# Patient Record
Sex: Female | Born: 1935 | Race: White | Hispanic: No | Smoking: Former smoker
Health system: Southern US, Community
[De-identification: ages and names within clinical notes are randomized; demographics above are authoritative.]

## PROBLEM LIST (undated history)

## (undated) DIAGNOSIS — M549 Dorsalgia, unspecified: Secondary | ICD-10-CM

## (undated) DIAGNOSIS — Z860100 Personal history of colon polyps, unspecified: Secondary | ICD-10-CM

## (undated) DIAGNOSIS — I10 Essential (primary) hypertension: Secondary | ICD-10-CM

## (undated) DIAGNOSIS — R011 Cardiac murmur, unspecified: Secondary | ICD-10-CM

## (undated) DIAGNOSIS — K219 Gastro-esophageal reflux disease without esophagitis: Secondary | ICD-10-CM

## (undated) DIAGNOSIS — H9190 Unspecified hearing loss, unspecified ear: Secondary | ICD-10-CM

## (undated) DIAGNOSIS — R05 Cough: Secondary | ICD-10-CM

## (undated) DIAGNOSIS — K649 Unspecified hemorrhoids: Secondary | ICD-10-CM

## (undated) DIAGNOSIS — Z9289 Personal history of other medical treatment: Secondary | ICD-10-CM

## (undated) DIAGNOSIS — Z8489 Family history of other specified conditions: Secondary | ICD-10-CM

## (undated) DIAGNOSIS — R0600 Dyspnea, unspecified: Secondary | ICD-10-CM

## (undated) DIAGNOSIS — M1990 Unspecified osteoarthritis, unspecified site: Secondary | ICD-10-CM

## (undated) DIAGNOSIS — F419 Anxiety disorder, unspecified: Secondary | ICD-10-CM

## (undated) DIAGNOSIS — R350 Frequency of micturition: Secondary | ICD-10-CM

## (undated) DIAGNOSIS — K5790 Diverticulosis of intestine, part unspecified, without perforation or abscess without bleeding: Secondary | ICD-10-CM

## (undated) DIAGNOSIS — G8929 Other chronic pain: Secondary | ICD-10-CM

## (undated) DIAGNOSIS — R059 Cough, unspecified: Secondary | ICD-10-CM

## (undated) DIAGNOSIS — J45909 Unspecified asthma, uncomplicated: Secondary | ICD-10-CM

## (undated) HISTORY — PX: CERVICAL FUSION: SHX112

## (undated) HISTORY — PX: BACK SURGERY: SHX140

## (undated) HISTORY — PX: EYE SURGERY: SHX253

## (undated) HISTORY — PX: COLONOSCOPY: SHX174

## (undated) HISTORY — PX: JOINT REPLACEMENT: SHX530

---

## 2012-12-30 ENCOUNTER — Other Ambulatory Visit: Payer: Self-pay | Admitting: Neurological Surgery

## 2013-01-01 ENCOUNTER — Encounter (HOSPITAL_COMMUNITY): Payer: Self-pay | Admitting: Pharmacy Technician

## 2013-01-05 ENCOUNTER — Encounter (HOSPITAL_COMMUNITY): Payer: Self-pay | Admitting: *Deleted

## 2013-01-05 NOTE — Progress Notes (Signed)
Pt saw a cardiologist >68yrs ago but doesn't have one now  Stress test/echo done >1yrs ago  Denies ever having a heart cath  Dr.Randall Mellody Dance at [facility]  Denies EKG or CXR in past yr

## 2013-01-06 MED ORDER — CEFAZOLIN SODIUM-DEXTROSE 2-3 GM-% IV SOLR
2.0000 g | INTRAVENOUS | Status: AC
  Administered 2013-01-07: 2 g via INTRAVENOUS

## 2013-01-07 ENCOUNTER — Ambulatory Visit (HOSPITAL_COMMUNITY): Payer: Medicare Other

## 2013-01-07 ENCOUNTER — Encounter (HOSPITAL_COMMUNITY)
Admission: RE | Disposition: A | Discharge status: Home / Self Care | Payer: Self-pay | Source: Ambulatory Visit | Attending: Neurological Surgery

## 2013-01-07 ENCOUNTER — Encounter (HOSPITAL_COMMUNITY): Payer: Self-pay | Admitting: Anesthesiology

## 2013-01-07 ENCOUNTER — Ambulatory Visit (HOSPITAL_COMMUNITY)
Admission: RE | Admit: 2013-01-07 | Discharge: 2013-01-08 | Disposition: A | Discharge status: Home / Self Care | Payer: Medicare Other | Source: Ambulatory Visit | Attending: Neurological Surgery | Admitting: Neurological Surgery

## 2013-01-07 ENCOUNTER — Ambulatory Visit (HOSPITAL_COMMUNITY): Payer: Medicare Other | Admitting: Anesthesiology

## 2013-01-07 DIAGNOSIS — I10 Essential (primary) hypertension: Secondary | ICD-10-CM | POA: Insufficient documentation

## 2013-01-07 DIAGNOSIS — M5126 Other intervertebral disc displacement, lumbar region: Secondary | ICD-10-CM | POA: Insufficient documentation

## 2013-01-07 DIAGNOSIS — Z9889 Other specified postprocedural states: Secondary | ICD-10-CM

## 2013-01-07 DIAGNOSIS — Z981 Arthrodesis status: Secondary | ICD-10-CM | POA: Insufficient documentation

## 2013-01-07 HISTORY — DX: Personal history of other medical treatment: Z92.89

## 2013-01-07 HISTORY — PX: LUMBAR LAMINECTOMY/DECOMPRESSION MICRODISCECTOMY: SHX5026

## 2013-01-07 HISTORY — DX: Cough, unspecified: R05.9

## 2013-01-07 HISTORY — DX: Other chronic pain: G89.29

## 2013-01-07 HISTORY — DX: Personal history of colon polyps, unspecified: Z86.0100

## 2013-01-07 HISTORY — DX: Unspecified asthma, uncomplicated: J45.909

## 2013-01-07 HISTORY — DX: Anxiety disorder, unspecified: F41.9

## 2013-01-07 HISTORY — DX: Personal history of colonic polyps: Z86.010

## 2013-01-07 HISTORY — DX: Dorsalgia, unspecified: M54.9

## 2013-01-07 HISTORY — DX: Unspecified hemorrhoids: K64.9

## 2013-01-07 HISTORY — DX: Cough: R05

## 2013-01-07 HISTORY — DX: Family history of other specified conditions: Z84.89

## 2013-01-07 HISTORY — DX: Gastro-esophageal reflux disease without esophagitis: K21.9

## 2013-01-07 HISTORY — DX: Diverticulosis of intestine, part unspecified, without perforation or abscess without bleeding: K57.90

## 2013-01-07 HISTORY — DX: Cardiac murmur, unspecified: R01.1

## 2013-01-07 HISTORY — DX: Unspecified osteoarthritis, unspecified site: M19.90

## 2013-01-07 HISTORY — DX: Essential (primary) hypertension: I10

## 2013-01-07 HISTORY — DX: Frequency of micturition: R35.0

## 2013-01-07 LAB — CBC WITH DIFFERENTIAL/PLATELET
Basophils Absolute: 0 10*3/uL (ref 0.0–0.1)
Basophils Relative: 0 % (ref 0–1)
Eosinophils Absolute: 0.2 10*3/uL (ref 0.0–0.7)
Eosinophils Relative: 2 % (ref 0–5)
HCT: 39 % (ref 36.0–46.0)
Hemoglobin: 13.3 g/dL (ref 12.0–15.0)
Lymphocytes Relative: 27 % (ref 12–46)
Lymphs Abs: 2.3 10*3/uL (ref 0.7–4.0)
MCH: 30.4 pg (ref 26.0–34.0)
MCHC: 34.1 g/dL (ref 30.0–36.0)
MCV: 89.2 fL (ref 78.0–100.0)
Monocytes Absolute: 0.9 10*3/uL (ref 0.1–1.0)
Monocytes Relative: 10 % (ref 3–12)
Neutro Abs: 5.3 10*3/uL (ref 1.7–7.7)
Neutrophils Relative %: 61 % (ref 43–77)
Platelets: 272 10*3/uL (ref 150–400)
RBC: 4.37 MIL/uL (ref 3.87–5.11)
RDW: 13.2 % (ref 11.5–15.5)
WBC: 8.7 10*3/uL (ref 4.0–10.5)

## 2013-01-07 LAB — BASIC METABOLIC PANEL
BUN: 19 mg/dL (ref 6–23)
CO2: 30 mEq/L (ref 19–32)
Calcium: 9.7 mg/dL (ref 8.4–10.5)
Chloride: 102 mEq/L (ref 96–112)
Creatinine, Ser: 0.77 mg/dL (ref 0.50–1.10)
GFR calc Af Amer: 87 mL/min — ABNORMAL LOW (ref >90)
GFR calc non Af Amer: 75 mL/min — ABNORMAL LOW (ref >90)
Glucose, Bld: 89 mg/dL (ref 70–99)
Potassium: 3.8 mEq/L (ref 3.5–5.1)
Sodium: 139 mEq/L (ref 135–145)

## 2013-01-07 LAB — PROTIME-INR
INR: 1.02 (ref 0.00–1.49)
Prothrombin Time: 13.3 seconds (ref 11.6–15.2)

## 2013-01-07 LAB — SURGICAL PCR SCREEN
MRSA, PCR: NEGATIVE
Staphylococcus aureus: NEGATIVE

## 2013-01-07 SURGERY — LUMBAR LAMINECTOMY/DECOMPRESSION MICRODISCECTOMY 1 LEVEL
Anesthesia: General | Site: Back | Laterality: Left | Wound class: Clean

## 2013-01-07 MED ORDER — DEXAMETHASONE SODIUM PHOSPHATE 10 MG/ML IJ SOLN: 10.0000 mg | INTRAMUSCULAR | Status: DC

## 2013-01-07 MED ORDER — SODIUM CHLORIDE 0.9 % IJ SOLN: 3.0000 mL | INTRAMUSCULAR | Status: DC | PRN

## 2013-01-07 MED ORDER — LACTATED RINGERS IV SOLN
INTRAVENOUS | Status: DC | PRN
  Administered 2013-01-07 (×2): via INTRAVENOUS

## 2013-01-07 MED ORDER — NEOSTIGMINE METHYLSULFATE 1 MG/ML IJ SOLN
INTRAMUSCULAR | Status: DC | PRN
  Administered 2013-01-07: 3 mg via INTRAVENOUS

## 2013-01-07 MED ORDER — CEFAZOLIN SODIUM 1-5 GM-% IV SOLN
1.0000 g | Freq: Three times a day (TID) | INTRAVENOUS | Status: AC
  Administered 2013-01-07 – 2013-01-08 (×2): 1 g via INTRAVENOUS
  Filled 2013-01-07 (×2): qty 50

## 2013-01-07 MED ORDER — ONDANSETRON HCL 4 MG/2ML IJ SOLN: 4.0000 mg | Freq: Once | INTRAMUSCULAR | Status: DC | PRN

## 2013-01-07 MED ORDER — LIDOCAINE HCL (CARDIAC) 20 MG/ML IV SOLN
INTRAVENOUS | Status: DC | PRN
  Administered 2013-01-07: 60 mg via INTRAVENOUS

## 2013-01-07 MED ORDER — BACITRACIN 50000 UNITS IM SOLR
INTRAMUSCULAR | Status: AC
  Filled 2013-01-07: qty 1

## 2013-01-07 MED ORDER — GELATIN ABSORBABLE MT POWD
OROMUCOSAL | Status: DC | PRN
  Administered 2013-01-07: 15:00:00 via TOPICAL

## 2013-01-07 MED ORDER — SODIUM CHLORIDE 0.9 % IR SOLN
Status: DC | PRN
  Administered 2013-01-07: 14:00:00

## 2013-01-07 MED ORDER — ONDANSETRON HCL 4 MG/2ML IJ SOLN
INTRAMUSCULAR | Status: DC | PRN
  Administered 2013-01-07: 4 mg via INTRAVENOUS

## 2013-01-07 MED ORDER — HYDROMORPHONE HCL PF 1 MG/ML IJ SOLN: 0.2500 mg | INTRAMUSCULAR | Status: DC | PRN

## 2013-01-07 MED ORDER — LIDOCAINE HCL 4 % MT SOLN
OROMUCOSAL | Status: DC | PRN
  Administered 2013-01-07: 4 mL via TOPICAL

## 2013-01-07 MED ORDER — ONDANSETRON HCL 4 MG/2ML IJ SOLN: 4.0000 mg | INTRAMUSCULAR | Status: DC | PRN

## 2013-01-07 MED ORDER — SODIUM CHLORIDE 0.9 % IV SOLN
INTRAVENOUS | Status: AC
  Filled 2013-01-07: qty 500

## 2013-01-07 MED ORDER — CITALOPRAM HYDROBROMIDE 10 MG PO TABS
10.0000 mg | ORAL_TABLET | Freq: Every day | ORAL | Status: DC
Start: 2013-01-07 — End: 2013-01-08
  Filled 2013-01-07 (×2): qty 1

## 2013-01-07 MED ORDER — GLYCOPYRROLATE 0.2 MG/ML IJ SOLN
INTRAMUSCULAR | Status: DC | PRN
  Administered 2013-01-07 (×2): 0.2 mg via INTRAVENOUS

## 2013-01-07 MED ORDER — FENTANYL CITRATE 0.05 MG/ML IJ SOLN
INTRAMUSCULAR | Status: AC
  Filled 2013-01-07: qty 2

## 2013-01-07 MED ORDER — HYDROCODONE-ACETAMINOPHEN 5-325 MG PO TABS: 1.0000 | ORAL_TABLET | ORAL | Status: DC | PRN

## 2013-01-07 MED ORDER — DEXAMETHASONE 4 MG PO TABS
4.0000 mg | ORAL_TABLET | Freq: Four times a day (QID) | ORAL | Status: DC
  Administered 2013-01-07: 4 mg via ORAL
  Filled 2013-01-07 (×5): qty 1

## 2013-01-07 MED ORDER — DEXAMETHASONE SODIUM PHOSPHATE 4 MG/ML IJ SOLN
4.0000 mg | Freq: Four times a day (QID) | INTRAMUSCULAR | Status: DC
  Administered 2013-01-08: 4 mg via INTRAVENOUS
  Filled 2013-01-07 (×5): qty 1

## 2013-01-07 MED ORDER — ALBUMIN HUMAN 5 % IV SOLN
INTRAVENOUS | Status: DC | PRN
  Administered 2013-01-07: 14:00:00 via INTRAVENOUS

## 2013-01-07 MED ORDER — CEFAZOLIN SODIUM-DEXTROSE 2-3 GM-% IV SOLR
2.0000 g | Freq: Once | INTRAVENOUS | Status: DC
  Filled 2013-01-07: qty 50

## 2013-01-07 MED ORDER — THROMBIN 5000 UNITS EX SOLR
CUTANEOUS | Status: DC | PRN
  Administered 2013-01-07 (×2): 5000 [IU] via TOPICAL

## 2013-01-07 MED ORDER — BUPIVACAINE HCL (PF) 0.25 % IJ SOLN
INTRAMUSCULAR | Status: DC | PRN
  Administered 2013-01-07: 10 mL

## 2013-01-07 MED ORDER — ACETAMINOPHEN 10 MG/ML IV SOLN
INTRAVENOUS | Status: AC
  Administered 2013-01-07: 1000 mg via INTRAVENOUS
  Filled 2013-01-07: qty 100

## 2013-01-07 MED ORDER — ARTIFICIAL TEARS OP OINT
TOPICAL_OINTMENT | OPHTHALMIC | Status: DC | PRN
  Administered 2013-01-07: 1 via OPHTHALMIC

## 2013-01-07 MED ORDER — MENTHOL 3 MG MT LOZG: 1.0000 | LOZENGE | OROMUCOSAL | Status: DC | PRN

## 2013-01-07 MED ORDER — ROCURONIUM BROMIDE 100 MG/10ML IV SOLN
INTRAVENOUS | Status: DC | PRN
  Administered 2013-01-07: 30 mg via INTRAVENOUS

## 2013-01-07 MED ORDER — MORPHINE SULFATE 2 MG/ML IJ SOLN: 1.0000 mg | INTRAMUSCULAR | Status: DC | PRN

## 2013-01-07 MED ORDER — MUPIROCIN 2 % EX OINT
TOPICAL_OINTMENT | CUTANEOUS | Status: AC
  Filled 2013-01-07: qty 22

## 2013-01-07 MED ORDER — PHENOL 1.4 % MT LIQD: 1.0000 | OROMUCOSAL | Status: DC | PRN

## 2013-01-07 MED ORDER — FENTANYL CITRATE 0.05 MG/ML IJ SOLN
INTRAMUSCULAR | Status: DC | PRN
  Administered 2013-01-07: 100 ug via INTRAVENOUS
  Administered 2013-01-07: 50 ug via INTRAVENOUS

## 2013-01-07 MED ORDER — ACETAMINOPHEN 325 MG PO TABS: 650.0000 mg | ORAL_TABLET | ORAL | Status: DC | PRN

## 2013-01-07 MED ORDER — PROPOFOL 10 MG/ML IV BOLUS
INTRAVENOUS | Status: DC | PRN
  Administered 2013-01-07: 120 mg via INTRAVENOUS

## 2013-01-07 MED ORDER — DEXAMETHASONE SODIUM PHOSPHATE 10 MG/ML IJ SOLN
INTRAMUSCULAR | Status: AC
  Administered 2013-01-07: 10 mg via INTRAVENOUS
  Filled 2013-01-07: qty 1

## 2013-01-07 MED ORDER — POTASSIUM CHLORIDE IN NACL 20-0.9 MEQ/L-% IV SOLN
INTRAVENOUS | Status: DC
  Filled 2013-01-07 (×3): qty 1000

## 2013-01-07 MED ORDER — HEMOSTATIC AGENTS (NO CHARGE) OPTIME
TOPICAL | Status: DC | PRN
  Administered 2013-01-07: 1 via TOPICAL

## 2013-01-07 MED ORDER — BUDESONIDE-FORMOTEROL FUMARATE 160-4.5 MCG/ACT IN AERO
2.0000 | INHALATION_SPRAY | Freq: Every day | RESPIRATORY_TRACT | Status: DC | PRN
  Filled 2013-01-07: qty 6

## 2013-01-07 MED ORDER — SODIUM CHLORIDE 0.9 % IJ SOLN
3.0000 mL | Freq: Two times a day (BID) | INTRAMUSCULAR | Status: DC
  Administered 2013-01-07: 3 mL via INTRAVENOUS

## 2013-01-07 MED ORDER — IRBESARTAN 75 MG PO TABS
75.0000 mg | ORAL_TABLET | Freq: Every day | ORAL | Status: DC
  Administered 2013-01-07: 75 mg via ORAL
  Filled 2013-01-07 (×2): qty 1

## 2013-01-07 MED ORDER — ACETAMINOPHEN 650 MG RE SUPP: 650.0000 mg | RECTAL | Status: DC | PRN

## 2013-01-07 MED ORDER — METOPROLOL SUCCINATE ER 25 MG PO TB24
25.0000 mg | ORAL_TABLET | Freq: Every day | ORAL | Status: DC
  Filled 2013-01-07: qty 1

## 2013-01-07 MED ORDER — PHENYLEPHRINE HCL 10 MG/ML IJ SOLN
INTRAMUSCULAR | Status: DC | PRN
  Administered 2013-01-07: 120 ug via INTRAVENOUS
  Administered 2013-01-07 (×4): 80 ug via INTRAVENOUS
  Administered 2013-01-07: 40 ug via INTRAVENOUS
  Administered 2013-01-07 (×3): 80 ug via INTRAVENOUS

## 2013-01-07 MED ORDER — ACETAMINOPHEN 10 MG/ML IV SOLN
1000.0000 mg | Freq: Four times a day (QID) | INTRAVENOUS | Status: DC
  Administered 2013-01-07 – 2013-01-08 (×2): 1000 mg via INTRAVENOUS
  Filled 2013-01-07 (×3): qty 100

## 2013-01-07 SURGICAL SUPPLY — 47 items
BAG DECANTER FOR FLEXI CONT (MISCELLANEOUS) ×2 IMPLANT
BENZOIN TINCTURE PRP APPL 2/3 (GAUZE/BANDAGES/DRESSINGS) ×2 IMPLANT
BUR MATCHSTICK NEURO 3.0 LAGG (BURR) ×2 IMPLANT
CANISTER SUCTION 2500CC (MISCELLANEOUS) ×2 IMPLANT
CLOTH BEACON ORANGE TIMEOUT ST (SAFETY) ×2 IMPLANT
CONT SPEC 4OZ CLIKSEAL STRL BL (MISCELLANEOUS) ×2 IMPLANT
DRAPE LAPAROTOMY 100X72X124 (DRAPES) ×2 IMPLANT
DRAPE MICROSCOPE ZEISS OPMI (DRAPES) ×2 IMPLANT
DRAPE POUCH INSTRU U-SHP 10X18 (DRAPES) ×2 IMPLANT
DRAPE SURG 17X23 STRL (DRAPES) ×2 IMPLANT
DRESSING TELFA 8X3 (GAUZE/BANDAGES/DRESSINGS) ×2 IMPLANT
DRSG OPSITE 4X5.5 SM (GAUZE/BANDAGES/DRESSINGS) ×2 IMPLANT
DURAPREP 26ML APPLICATOR (WOUND CARE) ×2 IMPLANT
ELECT REM PT RETURN 9FT ADLT (ELECTROSURGICAL) ×2
ELECTRODE REM PT RTRN 9FT ADLT (ELECTROSURGICAL) ×1 IMPLANT
GAUZE SPONGE 4X4 16PLY XRAY LF (GAUZE/BANDAGES/DRESSINGS) IMPLANT
GLOVE BIOGEL M 8.0 STRL (GLOVE) ×4 IMPLANT
GLOVE BIOGEL PI IND STRL 6.5 (GLOVE) ×1 IMPLANT
GLOVE BIOGEL PI IND STRL 7.0 (GLOVE) ×2 IMPLANT
GLOVE BIOGEL PI INDICATOR 6.5 (GLOVE) ×1
GLOVE BIOGEL PI INDICATOR 7.0 (GLOVE) ×2
GLOVE SS BIOGEL STRL SZ 6.5 (GLOVE) ×2 IMPLANT
GLOVE SUPERSENSE BIOGEL SZ 6.5 (GLOVE) ×2
GLOVE SURG SS PI 7.0 STRL IVOR (GLOVE) ×6 IMPLANT
GOWN BRE IMP SLV AUR LG STRL (GOWN DISPOSABLE) ×2 IMPLANT
GOWN BRE IMP SLV AUR XL STRL (GOWN DISPOSABLE) ×6 IMPLANT
GOWN STRL REIN 2XL LVL4 (GOWN DISPOSABLE) IMPLANT
HEMOSTAT POWDER KIT SURGIFOAM (HEMOSTASIS) IMPLANT
HEMOSTAT POWDER SURGIFOAM 1G (HEMOSTASIS) ×2 IMPLANT
KIT BASIN OR (CUSTOM PROCEDURE TRAY) ×2 IMPLANT
KIT ROOM TURNOVER OR (KITS) ×2 IMPLANT
NEEDLE HYPO 25X1 1.5 SAFETY (NEEDLE) ×2 IMPLANT
NEEDLE SPNL 20GX3.5 QUINCKE YW (NEEDLE) IMPLANT
NS IRRIG 1000ML POUR BTL (IV SOLUTION) ×2 IMPLANT
PACK LAMINECTOMY NEURO (CUSTOM PROCEDURE TRAY) ×2 IMPLANT
PAD ARMBOARD 7.5X6 YLW CONV (MISCELLANEOUS) ×6 IMPLANT
RUBBERBAND STERILE (MISCELLANEOUS) ×4 IMPLANT
SPONGE SURGIFOAM ABS GEL SZ50 (HEMOSTASIS) ×2 IMPLANT
STRIP CLOSURE SKIN 1/2X4 (GAUZE/BANDAGES/DRESSINGS) ×2 IMPLANT
SUT VIC AB 0 CT1 18XCR BRD8 (SUTURE) ×1 IMPLANT
SUT VIC AB 0 CT1 8-18 (SUTURE) ×1
SUT VIC AB 2-0 CP2 18 (SUTURE) ×2 IMPLANT
SUT VIC AB 3-0 SH 8-18 (SUTURE) ×2 IMPLANT
SYR 20ML ECCENTRIC (SYRINGE) ×2 IMPLANT
TOWEL OR 17X24 6PK STRL BLUE (TOWEL DISPOSABLE) ×2 IMPLANT
TOWEL OR 17X26 10 PK STRL BLUE (TOWEL DISPOSABLE) ×2 IMPLANT
WATER STERILE IRR 1000ML POUR (IV SOLUTION) ×2 IMPLANT

## 2013-01-07 NOTE — Anesthesia Preprocedure Evaluation (Addendum)
Anesthesia Evaluation  Patient identified by MRN, date of birth, ID band Patient awake    Reviewed: Allergy & Precautions, H&P , NPO status , reviewed documented beta blocker date and time   History of Anesthesia Complications (+) Family history of anesthesia reaction  Airway Mallampati: I TM Distance: >3 FB Neck ROM: full    Dental  (+) Teeth Intact and Dental Advisory Given   Pulmonary asthma (inhaler) ,          Cardiovascular hypertension, Pt. on medications and Pt. on home beta blockers + Valvular Problems/Murmurs (murmur) Rhythm:regular Rate:Normal     Neuro/Psych PSYCHIATRIC DISORDERS Anxiety    GI/Hepatic Neg liver ROS, GERD-  Medicated and Controlled,  Endo/Other  negative endocrine ROS  Renal/GU negative Renal ROS     Musculoskeletal  (+) Arthritis -, Osteoarthritis,    Abdominal (+) + obese,   Peds  Hematology negative hematology ROS (+)   Anesthesia Other Findings   Reproductive/Obstetrics negative OB ROS                        Anesthesia Physical Anesthesia Plan  ASA: II  Anesthesia Plan: General   Post-op Pain Management:    Induction: Intravenous  Airway Management Planned: Oral ETT  Additional Equipment:   Intra-op Plan:   Post-operative Plan: Extubation in OR  Informed Consent: I have reviewed the patients History and Physical, chart, labs and discussed the procedure including the risks, benefits and alternatives for the proposed anesthesia with the patient or authorized representative who has indicated his/her understanding and acceptance.     Plan Discussed with: CRNA, Anesthesiologist and Surgeon  Anesthesia Plan Comments:         Anesthesia Quick Evaluation

## 2013-01-07 NOTE — Transfer of Care (Signed)
Immediate Anesthesia Transfer of Care Note  Patient: Robin Patterson  Procedure(s) Performed: Procedure(s) with comments: LEFT LUMBAR THREE TO FOUR LAMINECTOMY/DECOMPRESSION MICRODISCECTOMY (Left) - LEFT LUMBAR THREE TO FOUR LAMINECTOMY/DECOMPRESSION MICRODISCECTOMY  Patient Location: PACU  Anesthesia Type:General  Level of Consciousness: awake, oriented and patient cooperative  Airway & Oxygen Therapy: Patient Spontanous Breathing and Patient connected to nasal cannula oxygen  Post-op Assessment: Report given to PACU RN and Post -op Vital signs reviewed and stable  Post vital signs: Reviewed and stable  Complications: No apparent anesthesia complications

## 2013-01-07 NOTE — Plan of Care (Signed)
Problem: Consults Goal: Diagnosis - Spinal Surgery Outcome: Completed/Met Date Met:  01/07/13 Microdiscectomy

## 2013-01-07 NOTE — Anesthesia Postprocedure Evaluation (Signed)
  Anesthesia Post-op Note  Patient: Robin Patterson  Procedure(s) Performed: Procedure(s) with comments: LEFT LUMBAR THREE TO FOUR LAMINECTOMY/DECOMPRESSION MICRODISCECTOMY (Left) - LEFT LUMBAR THREE TO FOUR LAMINECTOMY/DECOMPRESSION MICRODISCECTOMY  Patient Location: PACU  Anesthesia Type:General  Level of Consciousness: awake, oriented and patient cooperative  Airway and Oxygen Therapy: Patient Spontanous Breathing  Post-op Pain: mild  Post-op Assessment: Post-op Vital signs reviewed, Patient's Cardiovascular Status Stable, Respiratory Function Stable, Patent Airway, No signs of Nausea or vomiting and Pain level controlled  Post-op Vital Signs: stable  Complications: No apparent anesthesia complications

## 2013-01-07 NOTE — Anesthesia Procedure Notes (Signed)
Procedure Name: Intubation Date/Time: 01/07/2013 1:45 PM Performed by: Leona Singleton A Pre-anesthesia Checklist: Patient identified Patient Re-evaluated:Patient Re-evaluated prior to inductionOxygen Delivery Method: Circle system utilized Preoxygenation: Pre-oxygenation with 100% oxygen Intubation Type: IV induction Ventilation: Mask ventilation without difficulty Laryngoscope Size: Miller and 2 Grade View: Grade I Tube type: Oral Tube size: 7.5 mm Number of attempts: 1 Airway Equipment and Method: Stylet and LTA kit utilized Placement Confirmation: ETT inserted through vocal cords under direct vision,  positive ETCO2 and breath sounds checked- equal and bilateral Secured at: 21 cm Tube secured with: Tape Dental Injury: Teeth and Oropharynx as per pre-operative assessment

## 2013-01-07 NOTE — Preoperative (Signed)
Beta Blockers   Reason not to administer Beta Blockers:toprol xl 01/07/13 0800

## 2013-01-07 NOTE — H&P (Signed)
Subjective: Patient is a 79 y.o. female admitted for L L3-4 microdiskectomy. Onset of symptoms was several weeks ago, gradually worsening since that time.  The pain is rated severe, and is located at the across the lower back and radiates to l leg. The pain is described as aching and occurs intermittently. The symptoms have been progressive. Symptoms are exacerbated by exercise and standing. MRI or CT showed HNP L3-4 left.   Past Medical History  Diagnosis Date  . Family history of anesthesia complication     sister wakes up extremely sick  . Hypertension     takes Benicar and Metoprolol daily  . Heart murmur   . Asthma     Symbicort inhaler prn  . Cough   . Chronic back pain     HNP  . Arthritis   . GERD (gastroesophageal reflux disease)     doesn't take any meds;watching what she eats  . Hemorrhoids   . History of colon polyps   . Diverticulosis   . Urinary frequency   . History of blood transfusion     no abnormal reaction noted  . Anxiety     takes Citalopram daily    Past Surgical History  Procedure Laterality Date  . Joint replacement      right  . Cervical fusion    . Eye surgery      bil cataract  . Colonoscopy      Prior to Admission medications   Medication Sig Start Date End Date Taking? Authorizing Provider  budesonide-formoterol (SYMBICORT) 160-4.5 MCG/ACT inhaler Inhale 2 puffs into the lungs daily as needed (for wheezing).   Yes Historical Provider, MD  Cholecalciferol (VITAMIN D3) 2000 UNITS capsule Take 2,000 Units by mouth daily.   Yes Historical Provider, MD  citalopram (CELEXA) 10 MG tablet Take 10 mg by mouth daily.   Yes Historical Provider, MD  ibuprofen (ADVIL,MOTRIN) 200 MG tablet Take 400 mg by mouth every 6 (six) hours as needed for pain.   Yes Historical Provider, MD  Boris Lown Oil 300 MG CAPS Take 1 capsule by mouth daily.   Yes Historical Provider, MD  metoprolol succinate (TOPROL-XL) 50 MG 24 hr tablet Take 25 mg by mouth daily. Take with or  immediately following a meal.   Yes Historical Provider, MD  Multiple Vitamin (MULTIVITAMIN WITH MINERALS) TABS Take 1 tablet by mouth daily.   Yes Historical Provider, MD  olmesartan (BENICAR) 20 MG tablet Take 20 mg by mouth daily.   Yes Historical Provider, MD   No Known Allergies  History  Substance Use Topics  . Smoking status: Never Smoker   . Smokeless tobacco: Not on file  . Alcohol Use: No    History reviewed. No pertinent family history.   Review of Systems  Positive ROS: neg  All other systems have been reviewed and were otherwise negative with the exception of those mentioned in the HPI and as above.  Objective: Vital signs in last 24 hours: Temp:  [97.5 F (36.4 C)] 97.5 F (36.4 C) (04/10 1036) Pulse Rate:  [86] 86 (04/10 1036) Resp:  [18] 18 (04/10 1036) BP: (143)/(86) 143/86 mmHg (04/10 1036) SpO2:  [98 %] 98 % (04/10 1036)  General Appearance: Alert, cooperative, no distress, appears stated age Head: Normocephalic, without obvious abnormality, atraumatic Eyes: PERRL, conjunctiva/corneas clear, EOM's intact, fundi benign, both eyes      Ears: Normal TM's and external ear canals, both ears Throat: Lips, mucosa, and tongue normal; teeth and gums normal Neck:  Supple, symmetrical, trachea midline, no adenopathy; thyroid: No enlargement/tenderness/nodules; no carotid bruit or JVD Back: Symmetric, no curvature, ROM normal, no CVA tenderness Lungs: Clear to auscultation bilaterally, respirations unlabored Heart: Regular rate and rhythm, S1 and S2 normal, no murmur, rub or gallop Abdomen: Soft, non-tender, bowel sounds active all four quadrants, no masses, no organomegaly Extremities: Extremities normal, atraumatic, no cyanosis or edema Pulses: 2+ and symmetric all extremities Skin: Skin color, texture, turgor normal, no rashes or lesions  NEUROLOGIC:   Mental status: Alert and oriented x4,  no aphasia, good attention span, fund of knowledge, and memory Motor  Exam - grossly normal Sensory Exam - grossly normal Reflexes: 1+ Coordination - grossly normal Gait - grossly normal Balance - grossly normal Cranial Nerves: I: smell Not tested  II: visual acuity  OS: nl    OD: nl  II: visual fields Full to confrontation  II: pupils Equal, round, reactive to light  III,VII: ptosis None  III,IV,VI: extraocular muscles  Full ROM  V: mastication Normal  V: facial light touch sensation  Normal  V,VII: corneal reflex  Present  VII: facial muscle function - upper  Normal  VII: facial muscle function - lower Normal  VIII: hearing Not tested  IX: soft palate elevation  Normal  IX,X: gag reflex Present  XI: trapezius strength  5/5  XI: sternocleidomastoid strength 5/5  XI: neck flexion strength  5/5  XII: tongue strength  Normal    Data Review Lab Results  Component Value Date   WBC 8.7 01/07/2013   HGB 13.3 01/07/2013   HCT 39.0 01/07/2013   MCV 89.2 01/07/2013   PLT 272 01/07/2013   Lab Results  Component Value Date   NA 139 01/07/2013   K 3.8 01/07/2013   CL 102 01/07/2013   CO2 30 01/07/2013   BUN 19 01/07/2013   CREATININE 0.77 01/07/2013   GLUCOSE 89 01/07/2013   Lab Results  Component Value Date   INR 1.02 01/07/2013    Assessment/Plan: Patient admitted for L L3-4 microdiskectomy. Patient has failed conservative therapy.  I explained the condition and procedure to the patient and answered any questions.  Patient wishes to proceed with procedure as planned. Understands risks/ benefits and typical outcomes of procedure.   JONES,DAVID S 01/07/2013 1:34 PM

## 2013-01-07 NOTE — Op Note (Signed)
01/07/2013  3:20 PM  PATIENT:  Robin Patterson  79 y.o. female  PRE-OPERATIVE DIAGNOSIS:  L L3-4 HNP with superior free fragment with L3 and L4 radiculopathy  POST-OPERATIVE DIAGNOSIS:  same  PROCEDURE:  Left L3-4 hemilaminectomy, medial facetectomy, and foraminotomies followed by microdiscectomy to decompress both the L3 and the L4 nerve roots  SURGEON:  Marikay Alar, MD  ASSISTANTS: Dr. Jeral Fruit  ANESTHESIA:   General  EBL: 50 ml  Total I/O In: 1250 [I.V.:1000; IV Piggyback:250] Out: 150 [Blood:150]  BLOOD ADMINISTERED:none  DRAINS: None   SPECIMEN:  No Specimen  INDICATION FOR PROCEDURE: This patient presented with severe left anterior thigh pain to the knee. She had an MRI which showed a herniated disc a free fragment extending up to the L3 nerve root. She tried medical management for many weeks without relief. Recommended a left microdiscectomy L3-4. Patient understood the risks, benefits, and alternatives and potential outcomes and wished to proceed.  PROCEDURE DETAILS: The patient was taken to the operating room and after induction of adequate generalized endotracheal anesthesia, the patient was rolled into the prone position on the Wilson frame and all pressure points were padded. The lumbar region was cleaned and then prepped with DuraPrep and draped in the usual sterile fashion. 5 cc of local anesthesia was injected and then a dorsal midline incision was made and carried down to the lumbo sacral fascia. The fascia was opened and the paraspinous musculature was taken down in a subperiosteal fashion to expose L3-4 on the left. Intraoperative x-ray confirmed my level, and then I used a combination of the high-speed drill and the Kerrison punches to perform a hemilaminectomy, medial facetectomy, and foraminotomy at L3-4 on the left. The underlying yellow ligament was opened and removed in a piecemeal fashion to expose the underlying dura and exiting L4 nerve root. I undercut  the lateral recess and dissected down until I was medial to and distal to the pedicle. The nerve root was well decompressed. A performed a generous hemilaminotomy and foraminotomies to identify both the L3 and the L4 nerve roots. We then gently retracted the nerve root medially with a retractor, coagulated the epidural venous vasculature, and  Found a small free fragment under the L3 nerve root on the left. I continued to palpate under the nerve root with a coronary dilator. I had a nice decompression of the nerve root and the midline. I then palpated with a coronary dilator along the L3 and L4 nerve roots and into the foramen to assure adequate decompression. I felt no more compression of the nerve root. I irrigated with saline solution containing bacitracin. Achieved hemostasis with bipolar cautery, lined the dura with Gelfoam, and then closed the fascia with 0 Vicryl. I closed the subcutaneous tissues with 2-0 Vicryl and the subcuticular tissues with 3-0 Vicryl. The skin was then closed with benzoin and Steri-Strips. The drapes were removed, a sterile dressing was applied. The patient was awakened from general anesthesia and transferred to the recovery room in stable condition. At the end of the procedure all sponge, needle and instrument counts were correct.   PLAN OF CARE: Admit to inpatient   PATIENT DISPOSITION:  PACU - hemodynamically stable.   Delay start of Pharmacological VTE agent (>24hrs) due to surgical blood loss or risk of bleeding:  yes

## 2013-01-08 ENCOUNTER — Encounter (HOSPITAL_COMMUNITY): Payer: Self-pay | Admitting: Neurological Surgery

## 2013-01-08 MED FILL — Mupirocin Oint 2%: CUTANEOUS | Qty: 22 | Status: AC

## 2013-01-08 NOTE — Discharge Summary (Signed)
Physician Discharge Summary  Patient ID: Robin Patterson MRN: [id number] DOB/AGE: Jul 16, 1926 79 y.o.  Admit date: 01/07/2013 Discharge date: 01/08/2013  Admission Diagnoses: L3-4 HNP with radiculopathy    Discharge Diagnoses: same   Discharged Condition: good  Hospital Course: The patient was admitted on 01/07/2013 and taken to the operating room where the patient underwent microdiscectomy at L3-4 on the left. The patient tolerated the procedure well and was taken to the recovery room and then to the floor in stable condition. The hospital course was routine. There were no complications. The wound remained clean dry and intact. Pt had appropriate back soreness though it was minimal and she was taking no pain medications. No complaints of leg pain or new N/T/W. The patient remained afebrile with stable vital signs, and tolerated a regular diet. The patient continued to increase activities, and pain was well controlled with oral pain medications.   Consults: None  Significant Diagnostic Studies:  Results for orders placed during the hospital encounter of 01/07/13  SURGICAL PCR SCREEN      Result Value Range   MRSA, PCR NEGATIVE  NEGATIVE   Staphylococcus aureus NEGATIVE  NEGATIVE  BASIC METABOLIC PANEL      Result Value Range   Sodium 139  135 - 145 mEq/L   Potassium 3.8  3.5 - 5.1 mEq/L   Chloride 102  96 - 112 mEq/L   CO2 30  19 - 32 mEq/L   Glucose, Bld 89  70 - 99 mg/dL   BUN 19  6 - 23 mg/dL   Creatinine, Ser 3.08  0.50 - 1.10 mg/dL   Calcium 9.7  8.4 - 65.7 mg/dL   GFR calc non Af Amer 75 (*) >90 mL/min   GFR calc Af Amer 87 (*) >90 mL/min  CBC WITH DIFFERENTIAL      Result Value Range   WBC 8.7  4.0 - 10.5 K/uL   RBC 4.37  3.87 - 5.11 MIL/uL   Hemoglobin 13.3  12.0 - 15.0 g/dL   HCT 84.6  96.2 - 95.2 %   MCV 89.2  78.0 - 100.0 fL   MCH 30.4  26.0 - 34.0 pg   MCHC 34.1  30.0 - 36.0 g/dL   RDW 84.1  32.4 - 40.1 %   Platelets 272  150 - 400 K/uL   Neutrophils  Relative 61  43 - 77 %   Neutro Abs 5.3  1.7 - 7.7 K/uL   Lymphocytes Relative 27  12 - 46 %   Lymphs Abs 2.3  0.7 - 4.0 K/uL   Monocytes Relative 10  3 - 12 %   Monocytes Absolute 0.9  0.1 - 1.0 K/uL   Eosinophils Relative 2  0 - 5 %   Eosinophils Absolute 0.2  0.0 - 0.7 K/uL   Basophils Relative 0  0 - 1 %   Basophils Absolute 0.0  0.0 - 0.1 K/uL  PROTIME-INR      Result Value Range   Prothrombin Time 13.3  11.6 - 15.2 seconds   INR 1.02  0.00 - 1.49    Chest 2 View  01/07/2013  *RADIOLOGY REPORT*  Clinical Data: Preop for back surgery.  CHEST - 2 VIEW  Comparison: None.  Findings: Patient rotated minimally right.  Normal heart size with mildly tortuous descending thoracic aorta. No pleural effusion or pneumothorax.  Left apical pleural thickening, mild.  Scarring at the lung bases, greater left than right. Mild to moderate S-shaped thoracolumbar spine curvature.  IMPRESSION:  Bibasilar scarring, without acute disease.   Original Report Authenticated By: Jeronimo Greaves, M.D.    Dg Lumbar Spine 2-3 Views  01/07/2013  *RADIOLOGY REPORT*  Clinical Data: For laminectomy.  LUMBAR SPINE - 2-3 VIEW  Comparison: None.  Findings: We are provided two intraoperative views of the lumbar spine in the lateral projection.  Images demonstrate a probe directed toward the L3-4 level.  IMPRESSION: L3-4 localization   Original Report Authenticated By: Holley Dexter, M.D.     Antibiotics:  Anti-infectives   Start     Dose/Rate Route Frequency Ordered Stop   01/07/13 2200  ceFAZolin (ANCEF) IVPB 1 g/50 mL premix     1 g 100 mL/hr over 30 Minutes Intravenous Every 8 hours 01/07/13 1832 01/08/13 0606   01/07/13 1429  bacitracin 50,000 Units in sodium chloride irrigation 0.9 % 500 mL irrigation  Status:  Discontinued       As needed 01/07/13 1430 01/07/13 1516   01/07/13 1328  bacitracin [address] UNITS injection    Comments:  LOWERY, STEVEN: cabinet override      01/07/13 1328 01/08/13 0129   01/07/13 1100   ceFAZolin (ANCEF) IVPB 2 g/50 mL premix  Status:  Discontinued     2 g 100 mL/hr over 30 Minutes Intravenous  Once 01/07/13 1049 01/07/13 1814   01/07/13 0600  ceFAZolin (ANCEF) IVPB 2 g/50 mL premix     2 g 100 mL/hr over 30 Minutes Intravenous On call to O.R. 01/06/13 1419 01/07/13 1357      Discharge Exam: Blood pressure 128/74, pulse 78, temperature 99.5 F (37.5 C), temperature source Oral, resp. rate 18, height 5\' 1"  (1.549 m), weight 63.504 kg (140 lb), SpO2 94.00%. Neurologic: Grossly normal Incision clean dry and intact  Discharge Medications:     Medication List    TAKE these medications       budesonide-formoterol 160-4.5 MCG/ACT inhaler  Commonly known as:  SYMBICORT  Inhale 2 puffs into the lungs daily as needed (for wheezing).     citalopram 10 MG tablet  Commonly known as:  CELEXA  Take 10 mg by mouth daily.     ibuprofen 200 MG tablet  Commonly known as:  ADVIL,MOTRIN  Take 400 mg by mouth every 6 (six) hours as needed for pain.     Krill Oil 300 MG Caps  Take 1 capsule by mouth daily.     metoprolol succinate 50 MG 24 hr tablet  Commonly known as:  TOPROL-XL  Take 25 mg by mouth daily. Take with or immediately following a meal.     multivitamin with minerals Tabs  Take 1 tablet by mouth daily.     olmesartan 20 MG tablet  Commonly known as:  BENICAR  Take 20 mg by mouth daily.     Vitamin D3 2000 UNITS capsule  Take 2,000 Units by mouth daily.        Disposition: Home   Final Dx:  Microdiscectomy L3-4 on the left      Discharge Orders   Future Orders Complete By Expires     Call MD for:  difficulty breathing, headache or visual disturbances  As directed     Call MD for:  persistant nausea and vomiting  As directed     Call MD for:  redness, tenderness, or signs of infection (pain, swelling, redness, odor or green/yellow discharge around incision site)  As directed     Call MD for:  severe uncontrolled pain  As directed  Call MD for:   temperature >100.4  As directed     Diet - low sodium heart healthy  As directed     Discharge instructions  As directed     Comments:      No strenuous activity, no driving, no bending, twisting or lifting more than 10 pounds. May shower normally.    Increase activity slowly  As directed     Remove dressing in 48 hours  As directed        Follow-up Information   Follow up with Theophil Thivierge S, MD In 2 weeks.   Contact information:   [address] [place] [address] [phone]        Signed: Tia Alert 01/08/2013, 9:48 AM

## 2013-01-08 NOTE — Progress Notes (Signed)
Pt. Alert and oriented,follows simple instructions, denies pain. Incision area without swelling, redness or S/S of infection. Voiding adequate clear yellow urine. Moving all extremities well and vitals stable and documented. Lumbar surgery notes instructions given to patient and family member for home safety and precautions. Pt. and family stated understanding of instructions given.  

## 2014-05-05 ENCOUNTER — Other Ambulatory Visit: Payer: Self-pay | Admitting: Neurological Surgery

## 2014-05-26 ENCOUNTER — Encounter (HOSPITAL_COMMUNITY): Payer: Self-pay | Admitting: Pharmacy Technician

## 2014-05-30 ENCOUNTER — Ambulatory Visit (HOSPITAL_COMMUNITY)
Admission: RE | Admit: 2014-05-30 | Discharge: 2014-05-30 | Disposition: A | Discharge status: Home / Self Care | Payer: Medicare Other | Source: Ambulatory Visit | Attending: Neurological Surgery | Admitting: Neurological Surgery

## 2014-05-30 ENCOUNTER — Encounter (HOSPITAL_COMMUNITY): Payer: Self-pay

## 2014-05-30 ENCOUNTER — Encounter (HOSPITAL_COMMUNITY)
Admission: RE | Admit: 2014-05-30 | Discharge: 2014-05-30 | Disposition: A | Discharge status: Home / Self Care | Payer: Medicare Other | Source: Ambulatory Visit | Attending: Neurological Surgery | Admitting: Neurological Surgery

## 2014-05-30 DIAGNOSIS — J4489 Other specified chronic obstructive pulmonary disease: Secondary | ICD-10-CM | POA: Insufficient documentation

## 2014-05-30 DIAGNOSIS — J449 Chronic obstructive pulmonary disease, unspecified: Secondary | ICD-10-CM | POA: Insufficient documentation

## 2014-05-30 DIAGNOSIS — Z01818 Encounter for other preprocedural examination: Secondary | ICD-10-CM | POA: Insufficient documentation

## 2014-05-30 HISTORY — DX: Unspecified hearing loss, unspecified ear: H91.90

## 2014-05-30 LAB — BASIC METABOLIC PANEL
Anion gap: 10 (ref 5–15)
BUN: 19 mg/dL (ref 6–23)
CO2: 27 mEq/L (ref 19–32)
Calcium: 10.4 mg/dL (ref 8.4–10.5)
Chloride: 105 mEq/L (ref 96–112)
Creatinine, Ser: 0.75 mg/dL (ref 0.50–1.10)
GFR calc Af Amer: 86 mL/min — ABNORMAL LOW (ref >90)
GFR calc non Af Amer: 75 mL/min — ABNORMAL LOW (ref >90)
Glucose, Bld: 83 mg/dL (ref 70–99)
Potassium: 5.6 mEq/L — ABNORMAL HIGH (ref 3.7–5.3)
Sodium: 142 mEq/L (ref 137–147)

## 2014-05-30 LAB — CBC WITH DIFFERENTIAL/PLATELET
Basophils Absolute: 0 10*3/uL (ref 0.0–0.1)
Basophils Relative: 1 % (ref 0–1)
Eosinophils Absolute: 0.2 10*3/uL (ref 0.0–0.7)
Eosinophils Relative: 2 % (ref 0–5)
HCT: 40.7 % (ref 36.0–46.0)
Hemoglobin: 14.2 g/dL (ref 12.0–15.0)
Lymphocytes Relative: 41 % (ref 12–46)
Lymphs Abs: 2.5 10*3/uL (ref 0.7–4.0)
MCH: 31.8 pg (ref 26.0–34.0)
MCHC: 34.9 g/dL (ref 30.0–36.0)
MCV: 91.3 fL (ref 78.0–100.0)
Monocytes Absolute: 0.5 10*3/uL (ref 0.1–1.0)
Monocytes Relative: 9 % (ref 3–12)
Neutro Abs: 2.9 10*3/uL (ref 1.7–7.7)
Neutrophils Relative %: 47 % (ref 43–77)
Platelets: 312 10*3/uL (ref 150–400)
RBC: 4.46 MIL/uL (ref 3.87–5.11)
RDW: 13.5 % (ref 11.5–15.5)
WBC: 6.1 10*3/uL (ref 4.0–10.5)

## 2014-05-30 LAB — PROTIME-INR
INR: 0.99 (ref 0.00–1.49)
Prothrombin Time: 13.1 seconds (ref 11.6–15.2)

## 2014-05-30 LAB — SURGICAL PCR SCREEN
MRSA, PCR: NEGATIVE
Staphylococcus aureus: NEGATIVE

## 2014-05-30 NOTE — Pre-Procedure Instructions (Signed)
Robin Patterson  05/30/2014   Your procedure is scheduled on:  Thursday, September 3.  Report to [facility] Admitting at 6:30 AM.  Call this number if you have problems the morning of surgery: [phone]   Remember:   Do not eat food or drink liquids after midnight.   Take these medicines the morning of surgery with A SIP OF WATER: citalopram (CELEXA), metoprolol (LOPRESSOR).   May use Symbicort.                Stop taking VItamins, Herbal Medications, Fish Oil, Ibuprofen (Advil); do not take Aspirin or Aspirin Products or Naproxen (Aleve).     Do not wear jewelry, make-up or nail polish.  Do not wear lotions, powders, or perfumes.   Do not shave 48 hours prior to surgery.   Do not bring valuables to the hospital.              [facility] is not responsible for any belongings or valuables.               Contacts, dentures or bridgework may not be worn into surgery.  Leave suitcase in the car. After surgery it may be brought to your room.  For patients admitted to the hospital, discharge time is determined by your treatment team.               Patients discharged the day of surgery will not be allowed to drive home.  Name and phone number of your driver: -   Special Instructions: Review  [facility] - Preparing For Surgery.   Please read over the following fact sheets that you were given: Pain Booklet, Coughing and Deep Breathing and Surgical Site Infection Prevention

## 2014-06-01 MED ORDER — DEXAMETHASONE SODIUM PHOSPHATE 10 MG/ML IJ SOLN
10.0000 mg | INTRAMUSCULAR | Status: AC
  Administered 2014-06-02: 10 mg via INTRAVENOUS
  Filled 2014-06-01: qty 1

## 2014-06-01 MED ORDER — CEFAZOLIN SODIUM-DEXTROSE 2-3 GM-% IV SOLR
2.0000 g | INTRAVENOUS | Status: AC
  Administered 2014-06-02: 2 g via INTRAVENOUS
  Filled 2014-06-01: qty 50

## 2014-06-01 NOTE — Progress Notes (Signed)
Left message on house and cell of new arrival time of 05:30

## 2014-06-02 ENCOUNTER — Ambulatory Visit (HOSPITAL_COMMUNITY): Payer: Medicare Other | Admitting: Certified Registered Nurse Anesthetist

## 2014-06-02 ENCOUNTER — Encounter (HOSPITAL_COMMUNITY): Payer: Medicare Other | Admitting: Certified Registered Nurse Anesthetist

## 2014-06-02 ENCOUNTER — Encounter (HOSPITAL_COMMUNITY): Payer: Self-pay | Admitting: Surgery

## 2014-06-02 ENCOUNTER — Encounter (HOSPITAL_COMMUNITY)
Admission: RE | Disposition: A | Discharge status: Home / Self Care | Payer: Self-pay | Source: Ambulatory Visit | Attending: Neurological Surgery

## 2014-06-02 ENCOUNTER — Ambulatory Visit (HOSPITAL_COMMUNITY)
Admission: RE | Admit: 2014-06-02 | Discharge: 2014-06-02 | Disposition: A | Discharge status: Home / Self Care | Payer: Medicare Other | Source: Ambulatory Visit | Attending: Neurological Surgery | Admitting: Neurological Surgery

## 2014-06-02 ENCOUNTER — Ambulatory Visit (HOSPITAL_COMMUNITY): Payer: Medicare Other

## 2014-06-02 DIAGNOSIS — J45909 Unspecified asthma, uncomplicated: Secondary | ICD-10-CM | POA: Diagnosis not present

## 2014-06-02 DIAGNOSIS — Z9889 Other specified postprocedural states: Secondary | ICD-10-CM

## 2014-06-02 DIAGNOSIS — R011 Cardiac murmur, unspecified: Secondary | ICD-10-CM | POA: Diagnosis not present

## 2014-06-02 DIAGNOSIS — R350 Frequency of micturition: Secondary | ICD-10-CM | POA: Diagnosis not present

## 2014-06-02 DIAGNOSIS — F411 Generalized anxiety disorder: Secondary | ICD-10-CM | POA: Insufficient documentation

## 2014-06-02 DIAGNOSIS — Z79899 Other long term (current) drug therapy: Secondary | ICD-10-CM | POA: Insufficient documentation

## 2014-06-02 DIAGNOSIS — M129 Arthropathy, unspecified: Secondary | ICD-10-CM | POA: Insufficient documentation

## 2014-06-02 DIAGNOSIS — I10 Essential (primary) hypertension: Secondary | ICD-10-CM | POA: Diagnosis not present

## 2014-06-02 DIAGNOSIS — M5126 Other intervertebral disc displacement, lumbar region: Secondary | ICD-10-CM | POA: Diagnosis present

## 2014-06-02 DIAGNOSIS — K219 Gastro-esophageal reflux disease without esophagitis: Secondary | ICD-10-CM | POA: Insufficient documentation

## 2014-06-02 HISTORY — PX: LUMBAR LAMINECTOMY/DECOMPRESSION MICRODISCECTOMY: SHX5026

## 2014-06-02 SURGERY — LUMBAR LAMINECTOMY/DECOMPRESSION MICRODISCECTOMY 1 LEVEL
Anesthesia: General | Site: Back | Laterality: Left

## 2014-06-02 MED ORDER — EPHEDRINE SULFATE 50 MG/ML IJ SOLN
INTRAMUSCULAR | Status: DC | PRN
  Administered 2014-06-02: 5 mg via INTRAVENOUS
  Administered 2014-06-02: 10 mg via INTRAVENOUS

## 2014-06-02 MED ORDER — LOSARTAN POTASSIUM 50 MG PO TABS
100.0000 mg | ORAL_TABLET | Freq: Every day | ORAL | Status: DC
  Administered 2014-06-02: 100 mg via ORAL
  Filled 2014-06-02: qty 2

## 2014-06-02 MED ORDER — FENTANYL CITRATE 0.05 MG/ML IJ SOLN
INTRAMUSCULAR | Status: DC | PRN
  Administered 2014-06-02: 100 ug via INTRAVENOUS

## 2014-06-02 MED ORDER — SODIUM CHLORIDE 0.9 % IR SOLN
Status: DC | PRN
  Administered 2014-06-02: 08:00:00

## 2014-06-02 MED ORDER — ALBUTEROL SULFATE HFA 108 (90 BASE) MCG/ACT IN AERS
INHALATION_SPRAY | RESPIRATORY_TRACT | Status: DC | PRN
  Administered 2014-06-02: 5 via RESPIRATORY_TRACT

## 2014-06-02 MED ORDER — POTASSIUM CHLORIDE IN NACL 20-0.9 MEQ/L-% IV SOLN
INTRAVENOUS | Status: DC
  Filled 2014-06-02 (×2): qty 1000

## 2014-06-02 MED ORDER — GLYCOPYRROLATE 0.2 MG/ML IJ SOLN
INTRAMUSCULAR | Status: DC | PRN
  Administered 2014-06-02: 0.6 mg via INTRAVENOUS

## 2014-06-02 MED ORDER — SODIUM CHLORIDE 0.9 % IJ SOLN: 3.0000 mL | INTRAMUSCULAR | Status: DC | PRN

## 2014-06-02 MED ORDER — METOPROLOL TARTRATE 25 MG PO TABS
25.0000 mg | ORAL_TABLET | Freq: Every day | ORAL | Status: DC
  Filled 2014-06-02: qty 1

## 2014-06-02 MED ORDER — ROCURONIUM BROMIDE 50 MG/5ML IV SOLN
INTRAVENOUS | Status: AC
  Filled 2014-06-02: qty 1

## 2014-06-02 MED ORDER — THROMBIN 5000 UNITS EX SOLR
OROMUCOSAL | Status: DC | PRN
  Administered 2014-06-02: 08:00:00 via TOPICAL

## 2014-06-02 MED ORDER — FENTANYL CITRATE 0.05 MG/ML IJ SOLN
INTRAMUSCULAR | Status: AC
  Filled 2014-06-02: qty 5

## 2014-06-02 MED ORDER — METHOCARBAMOL 500 MG PO TABS
500.0000 mg | ORAL_TABLET | Freq: Four times a day (QID) | ORAL | Status: DC | PRN
  Administered 2014-06-02: 500 mg via ORAL

## 2014-06-02 MED ORDER — THROMBIN 5000 UNITS EX SOLR
CUTANEOUS | Status: DC | PRN
  Administered 2014-06-02 (×2): 5000 [IU] via TOPICAL

## 2014-06-02 MED ORDER — OXYCODONE-ACETAMINOPHEN 5-325 MG PO TABS
1.0000 | ORAL_TABLET | ORAL | Status: DC | PRN
  Administered 2014-06-02: 2 via ORAL

## 2014-06-02 MED ORDER — MIDAZOLAM HCL 2 MG/2ML IJ SOLN
INTRAMUSCULAR | Status: AC
  Filled 2014-06-02: qty 2

## 2014-06-02 MED ORDER — MENTHOL 3 MG MT LOZG: 1.0000 | LOZENGE | OROMUCOSAL | Status: DC | PRN

## 2014-06-02 MED ORDER — METHOCARBAMOL 1000 MG/10ML IJ SOLN
500.0000 mg | Freq: Four times a day (QID) | INTRAVENOUS | Status: DC | PRN
  Filled 2014-06-02: qty 5

## 2014-06-02 MED ORDER — ONDANSETRON HCL 4 MG/2ML IJ SOLN
INTRAMUSCULAR | Status: DC | PRN
  Administered 2014-06-02: 4 mg via INTRAVENOUS

## 2014-06-02 MED ORDER — DEXAMETHASONE SODIUM PHOSPHATE 4 MG/ML IJ SOLN
4.0000 mg | Freq: Four times a day (QID) | INTRAMUSCULAR | Status: DC
Start: 2014-06-02 — End: 2014-06-02

## 2014-06-02 MED ORDER — PROPOFOL 10 MG/ML IV BOLUS
INTRAVENOUS | Status: DC | PRN
  Administered 2014-06-02: 120 mg via INTRAVENOUS
  Administered 2014-06-02: 10 mg via INTRAVENOUS

## 2014-06-02 MED ORDER — LIDOCAINE HCL (CARDIAC) 20 MG/ML IV SOLN
INTRAVENOUS | Status: DC | PRN
  Administered 2014-06-02: 50 mg via INTRAVENOUS

## 2014-06-02 MED ORDER — METHYLPREDNISOLONE ACETATE 80 MG/ML IJ SUSP
INTRAMUSCULAR | Status: DC | PRN
  Administered 2014-06-02: 80 mg

## 2014-06-02 MED ORDER — EPHEDRINE SULFATE 50 MG/ML IJ SOLN
INTRAMUSCULAR | Status: AC
  Filled 2014-06-02: qty 1

## 2014-06-02 MED ORDER — FENTANYL CITRATE 0.05 MG/ML IJ SOLN
INTRAMUSCULAR | Status: DC | PRN
  Administered 2014-06-02: 50 ug via INTRAVENOUS
  Administered 2014-06-02: 150 ug via INTRAVENOUS
  Administered 2014-06-02: 50 ug via INTRAVENOUS

## 2014-06-02 MED ORDER — PROPOFOL 10 MG/ML IV BOLUS
INTRAVENOUS | Status: AC
  Filled 2014-06-02: qty 20

## 2014-06-02 MED ORDER — LIDOCAINE HCL (CARDIAC) 20 MG/ML IV SOLN
INTRAVENOUS | Status: AC
  Filled 2014-06-02: qty 5

## 2014-06-02 MED ORDER — DEXAMETHASONE 4 MG PO TABS
4.0000 mg | ORAL_TABLET | Freq: Four times a day (QID) | ORAL | Status: DC
  Administered 2014-06-02: 4 mg via ORAL
  Filled 2014-06-02: qty 1

## 2014-06-02 MED ORDER — ACETAMINOPHEN 650 MG RE SUPP: 650.0000 mg | RECTAL | Status: DC | PRN

## 2014-06-02 MED ORDER — NEOSTIGMINE METHYLSULFATE 10 MG/10ML IV SOLN
INTRAVENOUS | Status: DC | PRN
  Administered 2014-06-02: 4 mg via INTRAVENOUS

## 2014-06-02 MED ORDER — PHENYLEPHRINE 40 MCG/ML (10ML) SYRINGE FOR IV PUSH (FOR BLOOD PRESSURE SUPPORT)
PREFILLED_SYRINGE | INTRAVENOUS | Status: AC
  Filled 2014-06-02: qty 10

## 2014-06-02 MED ORDER — OXYCODONE-ACETAMINOPHEN 5-325 MG PO TABS
ORAL_TABLET | ORAL | Status: AC
  Administered 2014-06-02: 2 via ORAL
  Filled 2014-06-02: qty 2

## 2014-06-02 MED ORDER — PHENYLEPHRINE HCL 10 MG/ML IJ SOLN
INTRAMUSCULAR | Status: DC | PRN
  Administered 2014-06-02: 120 ug via INTRAVENOUS
  Administered 2014-06-02: 40 ug via INTRAVENOUS

## 2014-06-02 MED ORDER — ACETAMINOPHEN 325 MG PO TABS: 650.0000 mg | ORAL_TABLET | ORAL | Status: DC | PRN

## 2014-06-02 MED ORDER — STERILE WATER FOR INJECTION IJ SOLN
INTRAMUSCULAR | Status: AC
  Filled 2014-06-02: qty 10

## 2014-06-02 MED ORDER — MORPHINE SULFATE 2 MG/ML IJ SOLN
1.0000 mg | INTRAMUSCULAR | Status: DC | PRN
Start: 2014-06-02 — End: 2014-06-02

## 2014-06-02 MED ORDER — METHOCARBAMOL 500 MG PO TABS
ORAL_TABLET | ORAL | Status: AC
  Administered 2014-06-02: 500 mg via ORAL
  Filled 2014-06-02: qty 1

## 2014-06-02 MED ORDER — HEMOSTATIC AGENTS (NO CHARGE) OPTIME
TOPICAL | Status: DC | PRN
  Administered 2014-06-02: 1 via TOPICAL

## 2014-06-02 MED ORDER — BUDESONIDE-FORMOTEROL FUMARATE 160-4.5 MCG/ACT IN AERO: 2.0000 | INHALATION_SPRAY | Freq: Every day | RESPIRATORY_TRACT | Status: DC | PRN

## 2014-06-02 MED ORDER — FENTANYL CITRATE 0.05 MG/ML IJ SOLN
INTRAMUSCULAR | Status: AC
  Filled 2014-06-02: qty 2

## 2014-06-02 MED ORDER — ROCURONIUM BROMIDE 100 MG/10ML IV SOLN
INTRAVENOUS | Status: DC | PRN
  Administered 2014-06-02: 40 mg via INTRAVENOUS

## 2014-06-02 MED ORDER — ONDANSETRON HCL 4 MG/2ML IJ SOLN
INTRAMUSCULAR | Status: AC
  Filled 2014-06-02: qty 2

## 2014-06-02 MED ORDER — CEFAZOLIN SODIUM 1-5 GM-% IV SOLN
1.0000 g | Freq: Three times a day (TID) | INTRAVENOUS | Status: DC
  Administered 2014-06-02: 1 g via INTRAVENOUS
  Filled 2014-06-02 (×2): qty 50

## 2014-06-02 MED ORDER — BUPIVACAINE HCL (PF) 0.25 % IJ SOLN
INTRAMUSCULAR | Status: DC | PRN
  Administered 2014-06-02: 4 mL

## 2014-06-02 MED ORDER — LACTATED RINGERS IV SOLN
INTRAVENOUS | Status: DC | PRN
  Administered 2014-06-02 (×2): via INTRAVENOUS

## 2014-06-02 MED ORDER — SODIUM CHLORIDE 0.9 % IV SOLN
250.0000 mL | INTRAVENOUS | Status: DC
Start: 2014-06-02 — End: 2014-06-02

## 2014-06-02 MED ORDER — ONDANSETRON HCL 4 MG/2ML IJ SOLN
4.0000 mg | INTRAMUSCULAR | Status: DC | PRN
Start: 2014-06-02 — End: 2014-06-02

## 2014-06-02 MED ORDER — OXYCODONE-ACETAMINOPHEN 5-325 MG PO TABS: 1.0000 | ORAL_TABLET | Freq: Four times a day (QID) | ORAL | Status: DC | PRN

## 2014-06-02 MED ORDER — PHENOL 1.4 % MT LIQD: 1.0000 | OROMUCOSAL | Status: DC | PRN

## 2014-06-02 MED ORDER — NEOSTIGMINE METHYLSULFATE 10 MG/10ML IV SOLN
INTRAVENOUS | Status: AC
  Filled 2014-06-02: qty 1

## 2014-06-02 MED ORDER — SODIUM CHLORIDE 0.9 % IJ SOLN: 3.0000 mL | Freq: Two times a day (BID) | INTRAMUSCULAR | Status: DC

## 2014-06-02 MED ORDER — METOPROLOL TARTRATE 50 MG PO TABS: 50.0000 mg | ORAL_TABLET | Freq: Every day | ORAL | Status: DC

## 2014-06-02 MED ORDER — CITALOPRAM HYDROBROMIDE 10 MG PO TABS: 10.0000 mg | ORAL_TABLET | Freq: Every day | ORAL | Status: DC

## 2014-06-02 MED ORDER — FENTANYL CITRATE 0.05 MG/ML IJ SOLN: 25.0000 ug | INTRAMUSCULAR | Status: DC | PRN

## 2014-06-02 MED ORDER — GLYCOPYRROLATE 0.2 MG/ML IJ SOLN
INTRAMUSCULAR | Status: AC
  Filled 2014-06-02: qty 3

## 2014-06-02 SURGICAL SUPPLY — 43 items
BAG DECANTER FOR FLEXI CONT (MISCELLANEOUS) ×2 IMPLANT
BENZOIN TINCTURE PRP APPL 2/3 (GAUZE/BANDAGES/DRESSINGS) ×2 IMPLANT
BUR MATCHSTICK NEURO 3.0 LAGG (BURR) ×2 IMPLANT
CANISTER SUCT 3000ML (MISCELLANEOUS) ×2 IMPLANT
CONT SPEC 4OZ CLIKSEAL STRL BL (MISCELLANEOUS) ×2 IMPLANT
DRAPE LAPAROTOMY 100X72X124 (DRAPES) ×2 IMPLANT
DRAPE MICROSCOPE LEICA (MISCELLANEOUS) ×2 IMPLANT
DRAPE POUCH INSTRU U-SHP 10X18 (DRAPES) ×2 IMPLANT
DRAPE SURG 17X23 STRL (DRAPES) ×2 IMPLANT
DRSG OPSITE 4X5.5 SM (GAUZE/BANDAGES/DRESSINGS) ×2 IMPLANT
DRSG OPSITE POSTOP 3X4 (GAUZE/BANDAGES/DRESSINGS) ×2 IMPLANT
DRSG TELFA 3X8 NADH (GAUZE/BANDAGES/DRESSINGS) ×2 IMPLANT
DURAPREP 26ML APPLICATOR (WOUND CARE) ×2 IMPLANT
ELECT REM PT RETURN 9FT ADLT (ELECTROSURGICAL) ×2
ELECTRODE REM PT RTRN 9FT ADLT (ELECTROSURGICAL) ×1 IMPLANT
GAUZE SPONGE 4X4 16PLY XRAY LF (GAUZE/BANDAGES/DRESSINGS) IMPLANT
GLOVE BIO SURGEON STRL SZ8 (GLOVE) ×2 IMPLANT
GLOVE INDICATOR 7.5 STRL GRN (GLOVE) ×2 IMPLANT
GLOVE SURG SS PI 7.0 STRL IVOR (GLOVE) ×4 IMPLANT
GOWN STRL REUS W/ TWL LRG LVL3 (GOWN DISPOSABLE) ×1 IMPLANT
GOWN STRL REUS W/ TWL XL LVL3 (GOWN DISPOSABLE) ×1 IMPLANT
GOWN STRL REUS W/TWL 2XL LVL3 (GOWN DISPOSABLE) IMPLANT
GOWN STRL REUS W/TWL LRG LVL3 (GOWN DISPOSABLE) ×1
GOWN STRL REUS W/TWL XL LVL3 (GOWN DISPOSABLE) ×1
HEMOSTAT POWDER KIT SURGIFOAM (HEMOSTASIS) IMPLANT
KIT BASIN OR (CUSTOM PROCEDURE TRAY) ×2 IMPLANT
KIT ROOM TURNOVER OR (KITS) ×2 IMPLANT
NEEDLE HYPO 25X1 1.5 SAFETY (NEEDLE) ×2 IMPLANT
NEEDLE SPNL 20GX3.5 QUINCKE YW (NEEDLE) IMPLANT
NS IRRIG 1000ML POUR BTL (IV SOLUTION) ×2 IMPLANT
PACK LAMINECTOMY NEURO (CUSTOM PROCEDURE TRAY) ×2 IMPLANT
PAD ARMBOARD 7.5X6 YLW CONV (MISCELLANEOUS) ×6 IMPLANT
RUBBERBAND STERILE (MISCELLANEOUS) ×4 IMPLANT
SPONGE SURGIFOAM ABS GEL SZ50 (HEMOSTASIS) ×2 IMPLANT
STRIP CLOSURE SKIN 1/2X4 (GAUZE/BANDAGES/DRESSINGS) ×2 IMPLANT
SUT VIC AB 0 CT1 18XCR BRD8 (SUTURE) ×1 IMPLANT
SUT VIC AB 0 CT1 8-18 (SUTURE) ×1
SUT VIC AB 2-0 CP2 18 (SUTURE) ×2 IMPLANT
SUT VIC AB 3-0 SH 8-18 (SUTURE) ×2 IMPLANT
SYR 20ML ECCENTRIC (SYRINGE) ×2 IMPLANT
TOWEL OR 17X24 6PK STRL BLUE (TOWEL DISPOSABLE) ×2 IMPLANT
TOWEL OR 17X26 10 PK STRL BLUE (TOWEL DISPOSABLE) ×2 IMPLANT
WATER STERILE IRR 1000ML POUR (IV SOLUTION) ×2 IMPLANT

## 2014-06-02 NOTE — Progress Notes (Signed)
Pt and family given D/C instructions with Rx, verbal understanding was provided. Pt's IV was removed prior to D/C. Pt's incision was covered with Honeycomb dressing and had no sign of infection. Pt D/C'd home via walking at 1720 per MD order. Pt was stable @ D/C and had no other needs at this time. Rema Fendt, RN

## 2014-06-02 NOTE — Anesthesia Postprocedure Evaluation (Signed)
  Anesthesia Post-op Note  Patient: Robin Patterson  Procedure(s) Performed: Procedure(s): Left Lumbar Three to Four Extraforaminal Microdiskectomy (Left)  Patient Location: PACU  Anesthesia Type:General  Level of Consciousness: awake  Airway and Oxygen Therapy: Patient Spontanous Breathing  Post-op Pain: mild  Post-op Assessment: Post-op Vital signs reviewed  Post-op Vital Signs: Reviewed  Last Vitals:  Filed Vitals:   06/02/14 0945  BP: 163/54  Pulse: 59  Temp:   Resp: 15    Complications: No apparent anesthesia complications

## 2014-06-02 NOTE — Discharge Instructions (Signed)
Wound Care °Keep incision covered and dry for one week.  If you shower prior to then, cover incision with plastic wrap.  °You may remove outer bandage after one week and shower.  °Do not put any creams, lotions, or ointments on incision. °Leave steri-strips on neck.  They will fall off by themselves. °Activity °Walk each and every day, increasing distance each day. °No lifting greater than 5 lbs.  Avoid bending, arching, or twisting. °No driving for 2 weeks; may ride as a passenger locally. °If provided with back brace, wear when out of bed.  It is not necessary to wear in bed. °Diet °Resume your normal diet.  °Return to Work °Will be discussed at you follow up appointment. °Call Your Doctor If Any of These Occur °Redness, drainage, or swelling at the wound.  °Temperature greater than 101 degrees. °Severe pain not relieved by pain medication. °Incision starts to come apart. °Follow Up Appt °Call today for appointment in 1-2 weeks ([phone]) or for problems.  If you have any hardware placed in your spine, you will need an x-ray before your appointment. ° ° °Microdiskectomy ° Your spine is made up of bones called vertebrae. Oval-shaped disks filled with a thick liquid sit between the vertebrae. The disks function like cushions and keep the bones from rubbing together. However, an injury or normal aging can cause a disk to bulge out (herniated disk). The herniated disk can press on a nerve root, which is painful. The pain may be in the lower back, or it might shoot down a leg. There also may be tingling or numbness. One way to stop the pain is with a minimally invasive surgery called microdiskectomy. The part of the disk that sticks out from the spine is removed. Only a small surgical cut (incision) is made in the back. Microdiskectomy is easier on the muscles and other tissues than traditional surgery.  °LET YOUR CAREGIVER KNOW ABOUT: °· Any allergies. °· The last time you had anything to eat or drink. This includes  water, gum, and candy. °· All medicines you are taking, including herbs, eyedrops, over-the-counter medicines and creams. °· Blood thinners (anticoagulants), aspirin, or other drugs that could affect blood clotting. °· Any history of blood clots. °· Any history of bleeding or other blood problems. °· Use of steroids (by mouth or as creams). °· Previous problems with anesthesia. °· Family history of anesthetic complications. °· Possibility of pregnancy, if this applies. °· Previous surgery. °· Smoking history. °· Any recent symptoms of colds or infections. °· Other health problems. °RISKS AND COMPLICATIONS  °· Leaking of spinal fluid. If this happens, you will need to stay in bed for a few days. °· Bleeding. °· Pain. °· Infection near the incision. °· Nerve damage. °· Blood clot in a leg. The clot can move to the lungs. This can be very serious. °· A herniated disk might come back (recur) and require additional surgery. °BEFORE THE PROCEDURE  °· A medical evaluation will be done. This may include: °¨ A physical exam. °¨ Blood tests. °¨ A test that checks heart rhythm (electrocardiography). °¨ Imaging tests such as a myelogram, chest X-ray, or MRI. °· Ask your caregiver about changing or stopping your regular medicines. °¨ You may need to stop using aspirin and nonsteroidal anti-inflammatory drugs (NSAIDs). This includes prescription drugs and over-the-counter drugs. If possible, do this 10 to 14 days before the procedure or as directed. You may also need to stop taking vitamin E. °¨ If you take   blood thinners, ask your caregiver when you should stop taking them. °· You may need a stool-softening medicine a few days before the procedure. °· Stop smoking 2 weeks before the procedure if you smoke. Smoking can slow down the healing process. °· The day before the procedure, eat only a light dinner. Do not eat or drink anything for at least 8 hours before the procedure. Ask if it is okay to take any needed medicines with a  sip of water. °· You may talk with the person who will be in charge of the anesthetic medicine during the procedure. °· Arrive at least 1 to 2 hours before the procedure or as directed. °PROCEDURE °· Small monitors will be put on your body. They are used to check your heart, blood pressure, and oxygen level. °· An intravenous (IV) access tube will be inserted in your vein. Medicine will be able to flow directly into your body through the IV. °· You might be given a medicine to help you relax (sedative). °· You will be given a medicine that makes you sleep (general anesthetic). °· Your back will be cleaned with a special solution to kill germs on the skin. °· Once you are asleep, the surgeon will make a small incision in the middle of your lower back. It is often just 1 to 2 inches long. °· Muscles in the back are not cut. They are moved to the side. °· Often, a small piece of bone needs to be removed. This creates a better "window" so the surgeon can see the herniated disk. °· The surgeon uses a microscope to check the disk and nerves near it. °· The part of the disk that is causing problems is removed. °· The area under the skin is closed with stitches. In time, these will go away on their own. °· The skin is closed with small stitches (sutures) or staples. °· A small bandage (dressing) is put over the incision. °· The procedure usually takes about 1 hour. °AFTER THE PROCEDURE  °· You will stay in a recovery area until the anesthesia has worn off. Your blood pressure and pulse will be checked every so often. °· Some pain is normal after this surgery. You will probably be given pain medicine while in the recovery area. °· You may be able to go home the same day as the surgery. Once you get up and start walking, you will be able to go home. Sometimes, an overnight stay is needed. °Document Released: 09/04/2009 Document Revised: 12/09/2011 Document Reviewed: 09/04/2009 °[employer]® Patient Information ©2015 [employer]  [employer]. This information is not intended to replace advice given to you by your health care provider. Make sure you discuss any questions you have with your health care provider. ° °

## 2014-06-02 NOTE — Transfer of Care (Signed)
Immediate Anesthesia Transfer of Care Note  Patient: Robin Patterson  Procedure(s) Performed: Procedure(s): Left Lumbar Three to Four Extraforaminal Microdiskectomy (Left)  Patient Location: PACU  Anesthesia Type:General  Level of Consciousness: patient cooperative and responds to stimulation  Airway & Oxygen Therapy: Patient Spontanous Breathing and Patient connected to face mask oxygen  Post-op Assessment: Report given to PACU RN, Post -op Vital signs reviewed and stable and Patient moving all extremities X 4  Post vital signs: Reviewed and stable  Complications: No apparent anesthesia complications

## 2014-06-02 NOTE — H&P (Signed)
Subjective: Patient is a 80 y.o. female admitted for extraforaminal microdiskectomy. Onset of symptoms was a few months ago, worse since that time.  The pain is rated severe, and is located at the low back and radiates to L thigh. The pain is described as aching and occurs constantly. The symptoms have been progressive. Symptoms are exacerbated by activity. MRI or CT showed HNP L3-4 L.  Past Medical History  Diagnosis Date  . Family history of anesthesia complication     sister wakes up extremely sick  . Hypertension     takes Benicar and Metoprolol daily  . Asthma     Symbicort inhaler prn  . Cough   . Chronic back pain     HNP  . Arthritis   . GERD (gastroesophageal reflux disease)     doesn't take any meds;watching what she eats  . Hemorrhoids   . History of colon polyps   . Diverticulosis   . Urinary frequency   . History of blood transfusion     no abnormal reaction noted  . Anxiety     takes Citalopram daily  . Heart murmur     not to worry   . HOH (hard of hearing)     Past Surgical History  Procedure Laterality Date  . Cervical fusion    . Eye surgery      bil cataract  . Colonoscopy    . Lumbar laminectomy/decompression microdiscectomy Left 01/07/2013    Procedure: LEFT LUMBAR THREE TO FOUR LAMINECTOMY/DECOMPRESSION MICRODISCECTOMY;  Surgeon: Tia Alert, MD;  Location: [facility];  Service: Neurosurgery;  Laterality: Left;  LEFT LUMBAR THREE TO FOUR LAMINECTOMY/DECOMPRESSION MICRODISCECTOMY  . Colonoscopy    . Back surgery    . Joint replacement      right    Prior to Admission medications   Medication Sig Start Date End Date Taking? Authorizing Provider  acetaminophen (TYLENOL) 500 MG tablet Take 1,000 mg by mouth every 6 (six) hours as needed.   Yes Historical Provider, MD  budesonide-formoterol (SYMBICORT) 160-4.5 MCG/ACT inhaler Inhale 2 puffs into the lungs daily as needed (for wheezing).   Yes Historical Provider, MD  Cholecalciferol (VITAMIN D3)  2000 UNITS capsule Take 2,000 Units by mouth daily.   Yes Historical Provider, MD  citalopram (CELEXA) 10 MG tablet Take 10 mg by mouth daily.   Yes Historical Provider, MD  ibuprofen (ADVIL,MOTRIN) 200 MG tablet Take 400 mg by mouth every 6 (six) hours as needed for pain.   Yes Historical Provider, MD  losartan (COZAAR) 100 MG tablet Take 100 mg by mouth daily.   Yes Historical Provider, MD  metoprolol (LOPRESSOR) 50 MG tablet Take 25-50 mg by mouth 2 (two) times daily. 50 mg in the am and 25 mg in the evening   Yes Historical Provider, MD  Multiple Vitamin (MULTIVITAMIN WITH MINERALS) TABS Take 1 tablet by mouth daily.   Yes Historical Provider, MD  Omega-3 Fatty Acids (FISH OIL) 1200 MG CAPS Take 1,200 mg by mouth daily.   Yes Historical Provider, MD   No Known Allergies  History  Substance Use Topics  . Smoking status: Never Smoker   . Smokeless tobacco: Not on file  . Alcohol Use: No    History reviewed. No pertinent family history.   Review of Systems  Positive ROS: neg  All other systems have been reviewed and were otherwise negative with the exception of those mentioned in the HPI and as above.  Objective: Vital signs in last  24 hours: Temp:  [97.8 F (36.6 C)] 97.8 F (36.6 C) (09/03 0637) Pulse Rate:  [73] 73 (09/03 0637) Resp:  [18] 18 (09/03 0637) BP: (161)/(54) 161/54 mmHg (09/03 0637) SpO2:  [100 %] 100 % (09/03 1610)  General Appearance: Alert, cooperative, no distress, appears stated age Head: Normocephalic, without obvious abnormality, atraumatic Eyes: PERRL, conjunctiva/corneas clear, EOM's intact    Neck: Supple, symmetrical, trachea midline Back: Symmetric, no curvature, ROM normal, no CVA tenderness Lungs:  respirations unlabored Heart: Regular rate and rhythm Abdomen: Soft, non-tender Extremities: Extremities normal, atraumatic, no cyanosis or edema Pulses: 2+ and symmetric all extremities Skin: Skin color, texture, turgor normal, no rashes or  lesions  NEUROLOGIC:   Mental status: Alert and oriented x4,  no aphasia, good attention span, fund of knowledge, and memory Motor Exam - grossly normal Sensory Exam - grossly normal Reflexes: trace Coordination - grossly normal Gait - grossly normal Balance - grossly normal Cranial Nerves: I: smell Not tested  II: visual acuity  OS: nl    OD: nl  II: visual fields Full to confrontation  II: pupils Equal, round, reactive to light  III,VII: ptosis None  III,IV,VI: extraocular muscles  Full ROM  V: mastication Normal  V: facial light touch sensation  Normal  V,VII: corneal reflex  Present  VII: facial muscle function - upper  Normal  VII: facial muscle function - lower Normal  VIII: hearing Not tested  IX: soft palate elevation  Normal  IX,X: gag reflex Present  XI: trapezius strength  5/5  XI: sternocleidomastoid strength 5/5  XI: neck flexion strength  5/5  XII: tongue strength  Normal    Data Review Lab Results  Component Value Date   WBC 6.1 05/30/2014   HGB 14.2 05/30/2014   HCT 40.7 05/30/2014   MCV 91.3 05/30/2014   PLT 312 05/30/2014   Lab Results  Component Value Date   NA 142 05/30/2014   K 5.6* 05/30/2014   CL 105 05/30/2014   CO2 27 05/30/2014   BUN 19 05/30/2014   CREATININE 0.75 05/30/2014   GLUCOSE 83 05/30/2014   Lab Results  Component Value Date   INR 0.99 05/30/2014    Assessment/Plan: Patient admitted for L L3-4 extraforaminal microdiskectomy. Patient has failed a reasonable attempt at conservative therapy.  I explained the condition and procedure to the patient and answered any questions.  Patient wishes to proceed with procedure as planned. Understands risks/ benefits and typical outcomes of procedure.   Sholom Dulude S 06/02/2014 6:51 AM

## 2014-06-02 NOTE — Discharge Summary (Signed)
Physician Discharge Summary  Patient ID: Robin Patterson MRN: [id number] DOB/AGE: 02/23/1926 80 y.o.  Admit date: 06/02/2014 Discharge date: 06/02/2014  Admission Diagnoses: L3-4 HNP   Discharge Diagnoses: same   Discharged Condition: good  Hospital Course: The patient was admitted on 06/02/2014 and taken to the operating room where the patient underwent L3-4 extraforaminal microdiskectomy. The patient tolerated the procedure well and was taken to the recovery room and then to the floor in stable condition. The hospital course was routine. There were no complications. The wound remained clean dry and intact. Pt had appropriate back soreness. No complaints of leg pain or new N/T/W. The patient remained afebrile with stable vital signs, and tolerated a regular diet. The patient continued to increase activities, and pain was well controlled with oral pain medications.   Consults: None  Significant Diagnostic Studies:  Results for orders placed during the hospital encounter of 05/30/14  SURGICAL PCR SCREEN      Result Value Ref Range   MRSA, PCR NEGATIVE  NEGATIVE   Staphylococcus aureus NEGATIVE  NEGATIVE  BASIC METABOLIC PANEL      Result Value Ref Range   Sodium 142  137 - 147 mEq/L   Potassium 5.6 (*) 3.7 - 5.3 mEq/L   Chloride 105  96 - 112 mEq/L   CO2 27  19 - 32 mEq/L   Glucose, Bld 83  70 - 99 mg/dL   BUN 19  6 - 23 mg/dL   Creatinine, Ser 4.09  0.50 - 1.10 mg/dL   Calcium 81.1  8.4 - 91.4 mg/dL   GFR calc non Af Amer 75 (*) >90 mL/min   GFR calc Af Amer 86 (*) >90 mL/min   Anion gap 10  5 - 15  CBC WITH DIFFERENTIAL      Result Value Ref Range   WBC 6.1  4.0 - 10.5 K/uL   RBC 4.46  3.87 - 5.11 MIL/uL   Hemoglobin 14.2  12.0 - 15.0 g/dL   HCT 78.2  95.6 - 21.3 %   MCV 91.3  78.0 - 100.0 fL   MCH 31.8  26.0 - 34.0 pg   MCHC 34.9  30.0 - 36.0 g/dL   RDW 08.6  57.8 - 46.9 %   Platelets 312  150 - 400 K/uL   Neutrophils Relative % 47  43 - 77 %   Neutro Abs 2.9  1.7 - 7.7  K/uL   Lymphocytes Relative 41  12 - 46 %   Lymphs Abs 2.5  0.7 - 4.0 K/uL   Monocytes Relative 9  3 - 12 %   Monocytes Absolute 0.5  0.1 - 1.0 K/uL   Eosinophils Relative 2  0 - 5 %   Eosinophils Absolute 0.2  0.0 - 0.7 K/uL   Basophils Relative 1  0 - 1 %   Basophils Absolute 0.0  0.0 - 0.1 K/uL  PROTIME-INR      Result Value Ref Range   Prothrombin Time 13.1  11.6 - 15.2 seconds   INR 0.99  0.00 - 1.49    Chest 2 View  05/30/2014   CLINICAL DATA:  Preop a herniated disc.  EXAM: CHEST  2 VIEW  COMPARISON:  01/07/2013  FINDINGS: Lungs are hyperexpanded with mild flattening of the hemidiaphragms. There is no focal consolidation or effusion. The cardiomediastinal silhouette is within normal. There is calcified plaque over the thoracic aorta. There are degenerative changes of the spine with curvature of the thoracolumbar spine convex to the  left unchanged.  IMPRESSION: No active cardiopulmonary disease.  COPD.   Electronically Signed   By: Elberta Fortis M.D.   On: 05/30/2014 15:18   Dg Lumbar Spine 1 View  06/02/2014   CLINICAL DATA:  Left laminectomy L3-4  EXAM: LUMBAR SPINE - 1 VIEW  COMPARISON:  Lumbar spine MRI dated 04/29/2014  FINDINGS: Lumbar spine is numbered in concordance with prior MRI.  Surgical probe and hardware at L3-4.  IMPRESSION: Lumbar levels as above.   Electronically Signed   By: Charline Bills M.D.   On: 06/02/2014 09:33    Antibiotics:  Anti-infectives   Start     Dose/Rate Route Frequency Ordered Stop   06/02/14 1600  ceFAZolin (ANCEF) IVPB 1 g/50 mL premix     1 g 100 mL/hr over 30 Minutes Intravenous Every 8 hours 06/02/14 1103 06/03/14 0759   06/02/14 0819  bacitracin 50,000 Units in sodium chloride irrigation 0.9 % 500 mL irrigation  Status:  Discontinued       As needed 06/02/14 0820 06/02/14 0912   06/02/14 0600  ceFAZolin (ANCEF) IVPB 2 g/50 mL premix     2 g 100 mL/hr over 30 Minutes Intravenous On call to O.R. 06/01/14 1355 06/02/14 0735       Discharge Exam: Blood pressure 128/53, pulse 84, temperature 98.4 F (36.9 C), temperature source Oral, resp. rate 16, SpO2 96.00%. Neurologic: Grossly normal incisioon CDI  Discharge Medications:     Medication List         acetaminophen 500 MG tablet  Commonly known as:  TYLENOL  Take 1,000 mg by mouth every 6 (six) hours as needed.     budesonide-formoterol 160-4.5 MCG/ACT inhaler  Commonly known as:  SYMBICORT  Inhale 2 puffs into the lungs daily as needed (for wheezing).     citalopram 10 MG tablet  Commonly known as:  CELEXA  Take 10 mg by mouth daily.     Fish Oil 1200 MG Caps  Take 1,200 mg by mouth daily.     ibuprofen 200 MG tablet  Commonly known as:  ADVIL,MOTRIN  Take 400 mg by mouth every 6 (six) hours as needed for pain.     losartan 100 MG tablet  Commonly known as:  COZAAR  Take 100 mg by mouth daily.     metoprolol 50 MG tablet  Commonly known as:  LOPRESSOR  Take 25-50 mg by mouth 2 (two) times daily. 50 mg in the am and 25 mg in the evening     multivitamin with minerals Tabs tablet  Take 1 tablet by mouth daily.     oxyCODONE-acetaminophen 5-325 MG per tablet  Commonly known as:  PERCOCET/ROXICET  Take 1 tablet by mouth every 6 (six) hours as needed for moderate pain.     Vitamin D3 2000 UNITS capsule  Take 2,000 Units by mouth daily.        Disposition: home   Final Dx: L L3-4 extraforaminal microdiskectomy      Discharge Instructions   Call MD for:  difficulty breathing, headache or visual disturbances    Complete by:  As directed      Call MD for:  persistant nausea and vomiting    Complete by:  As directed      Call MD for:  redness, tenderness, or signs of infection (pain, swelling, redness, odor or green/yellow discharge around incision site)    Complete by:  As directed      Call MD for:  severe uncontrolled  pain    Complete by:  As directed      Call MD for:  temperature >100.4    Complete by:  As directed       Diet - low sodium heart healthy    Complete by:  As directed      Discharge instructions    Complete by:  As directed   No strenuous activity, no bending or twisting, may shower     Increase activity slowly    Complete by:  As directed      Remove dressing in 48 hours    Complete by:  As directed            Follow-up Information   Follow up with Rachid Parham S, MD. Schedule an appointment as soon as possible for a visit in 2 weeks.   Specialty:  Neurosurgery   Contact information:   [address] [place] [address] [phone]        Signed: Tia Alert 06/02/2014, 5:08 PM

## 2014-06-02 NOTE — Op Note (Signed)
06/02/2014  9:10 AM  PATIENT:  Robin Patterson  80 y.o. female  PRE-OPERATIVE DIAGNOSIS:  Left L3-4 extraforaminal herniated nucleus pulposus with left L3 radiculopathy  POST-OPERATIVE DIAGNOSIS:  Same  PROCEDURE:  Left L3-4 extraforaminal microdiscectomy utilizing microscopic dissection  SURGEON:  Marikay Alar, MD  ASSISTANTS: none  ANESTHESIA:   General  EBL: 50 ml  Total I/O In: 1000 [I.V.:1000] Out: 100 [Blood:100]  BLOOD ADMINISTERED:none  DRAINS: None   SPECIMEN:  No Specimen  INDICATION FOR PROCEDURE: This patient underwent a previous L3-4 microdiscectomy. She presented with recurrent left L3 radiculopathy. She had an MRI which showed a large herniated disc in the extraforaminal space at L3-4 on the left with a superior free fragment compressing the left L3 nerve root. She tried medical management without relief. I recommended a left L3 for extraforaminal microdiscectomy. Patient understood the risks, benefits, and alternatives and potential outcomes and wished to proceed.  PROCEDURE DETAILS: The patient was taken to the operating room and after induction of adequate generalized endotracheal anesthesia, the patient was rolled into the prone position on the Wilson frame and all pressure points were padded. The lumbar region was cleaned and then prepped with DuraPrep and draped in the usual sterile fashion. 5 cc of local anesthesia was injected and then a dorsal midline incision was made and carried down to the lumbo sacral fascia. The fascia was opened and the paraspinous musculature was taken down in a subperiosteal fashion to expose L3 -4 extraforaminal space on the left. Intraoperative x-ray confirmed my level, and then I used a combination of the high-speed drill and the Kerrison punches to remove the lateral part of the pars and superior part of the facet to perform a extraforaminal foraminotomy at L3-4 on the left. The underlying yellow ligament was opened and removed in a  piecemeal fashion to expose the underlying exiting nerve root. I retracted the nerve root superiorly and identified the disc space. I coagulated the epidural venous vasculature. I found a large subannular herniation as well as a large superior fragment. Several pieces of disc were removed from both the intradiscal space and superior to the nerve root in the axilla.  I then palpated with a coronary dilator along the nerve root and into the foramen to assure adequate decompression. I felt no more compression of the nerve root. I irrigated with saline solution containing bacitracin. Achieved hemostasis with bipolar cautery, placed a mixture of Depo-Medrol and fentanyl over the exposed nerve root, and then closed the fascia with 0 Vicryl. I closed the subcutaneous tissues with 2-0 Vicryl and the subcuticular tissues with 3-0 Vicryl. The skin was then closed with benzoin and Steri-Strips. The drapes were removed, a sterile dressing was applied. The patient was awakened from general anesthesia and transferred to the recovery room in stable condition. At the end of the procedure all sponge, needle and instrument counts were correct.   PLAN OF CARE: Admit for overnight observation  PATIENT DISPOSITION:  PACU - hemodynamically stable.   Delay start of Pharmacological VTE agent (>24hrs) due to surgical blood loss or risk of bleeding:  yes

## 2014-06-02 NOTE — Anesthesia Preprocedure Evaluation (Addendum)
Anesthesia Evaluation  Patient identified by MRN, date of birth, ID band Patient awake    Reviewed: Allergy & Precautions, H&P , NPO status , Patient's Chart, lab work & pertinent test results, reviewed documented beta blocker date and time   Airway Mallampati: II TM Distance: >3 FB Neck ROM: Full    Dental  (+) Teeth Intact, Dental Advisory Given, Upper Dentures   Pulmonary asthma ,  breath sounds clear to auscultation        Cardiovascular hypertension, Pt. on medications and Pt. on home beta blockers Rhythm:Regular Rate:Normal     Neuro/Psych negative neurological ROS     GI/Hepatic Neg liver ROS, GERD-  Controlled,  Endo/Other  negative endocrine ROS  Renal/GU negative Renal ROS     Musculoskeletal  (+) Arthritis -,   Abdominal   Peds  Hematology negative hematology ROS (+)   Anesthesia Other Findings   Reproductive/Obstetrics                          Anesthesia Physical Anesthesia Plan  ASA: III  Anesthesia Plan: General   Post-op Pain Management:    Induction: Intravenous  Airway Management Planned: Oral ETT  Additional Equipment:   Intra-op Plan:   Post-operative Plan: Extubation in OR  Informed Consent: I have reviewed the patients History and Physical, chart, labs and discussed the procedure including the risks, benefits and alternatives for the proposed anesthesia with the patient or authorized representative who has indicated his/her understanding and acceptance.   Dental advisory given  Plan Discussed with: Anesthesiologist, Surgeon and CRNA  Anesthesia Plan Comments:        Anesthesia Quick Evaluation

## 2014-06-03 ENCOUNTER — Encounter (HOSPITAL_COMMUNITY): Payer: Self-pay | Admitting: Neurological Surgery

## 2014-08-09 ENCOUNTER — Other Ambulatory Visit: Payer: Self-pay | Admitting: Neurological Surgery

## 2014-08-23 ENCOUNTER — Encounter (HOSPITAL_COMMUNITY): Payer: Self-pay | Admitting: *Deleted

## 2014-08-23 MED ORDER — CEFAZOLIN SODIUM-DEXTROSE 2-3 GM-% IV SOLR
2.0000 g | INTRAVENOUS | Status: AC
Start: 2014-08-24 — End: 2014-08-24
  Administered 2014-08-24: 2 g via INTRAVENOUS
  Filled 2014-08-23: qty 50

## 2014-08-23 MED ORDER — DEXAMETHASONE SODIUM PHOSPHATE 10 MG/ML IJ SOLN
10.0000 mg | INTRAMUSCULAR | Status: AC
  Administered 2014-08-24: 10 mg via INTRAVENOUS
  Filled 2014-08-23: qty 1

## 2014-08-23 NOTE — Progress Notes (Signed)
Pt denies SOB, chest pain, and being under the care of a cardiologist. Pt stated that she had some test many years ago ( >5 years) ago but can't remember what test. According to pt, " they put me on blood pressure medicine then."

## 2014-08-24 ENCOUNTER — Inpatient Hospital Stay (HOSPITAL_COMMUNITY): Payer: Medicare Other | Admitting: Anesthesiology

## 2014-08-24 ENCOUNTER — Ambulatory Visit (HOSPITAL_COMMUNITY): Payer: Medicare Other

## 2014-08-24 ENCOUNTER — Ambulatory Visit (HOSPITAL_COMMUNITY)
Admission: RE | Admit: 2014-08-24 | Discharge: 2014-08-25 | Disposition: A | Discharge status: Home / Self Care | Payer: Medicare Other | Source: Ambulatory Visit | Attending: Neurological Surgery | Admitting: Neurological Surgery

## 2014-08-24 ENCOUNTER — Encounter (HOSPITAL_COMMUNITY)
Admission: RE | Disposition: A | Discharge status: Home / Self Care | Payer: Self-pay | Source: Ambulatory Visit | Attending: Neurological Surgery

## 2014-08-24 ENCOUNTER — Encounter (HOSPITAL_COMMUNITY): Payer: Self-pay | Admitting: *Deleted

## 2014-08-24 DIAGNOSIS — J45909 Unspecified asthma, uncomplicated: Secondary | ICD-10-CM | POA: Diagnosis not present

## 2014-08-24 DIAGNOSIS — M5116 Intervertebral disc disorders with radiculopathy, lumbar region: Principal | ICD-10-CM | POA: Insufficient documentation

## 2014-08-24 DIAGNOSIS — K219 Gastro-esophageal reflux disease without esophagitis: Secondary | ICD-10-CM | POA: Diagnosis not present

## 2014-08-24 DIAGNOSIS — R011 Cardiac murmur, unspecified: Secondary | ICD-10-CM | POA: Insufficient documentation

## 2014-08-24 DIAGNOSIS — Z9889 Other specified postprocedural states: Secondary | ICD-10-CM

## 2014-08-24 DIAGNOSIS — IMO0002 Reserved for concepts with insufficient information to code with codable children: Secondary | ICD-10-CM

## 2014-08-24 DIAGNOSIS — I10 Essential (primary) hypertension: Secondary | ICD-10-CM | POA: Diagnosis not present

## 2014-08-24 DIAGNOSIS — Z86010 Personal history of colonic polyps: Secondary | ICD-10-CM | POA: Insufficient documentation

## 2014-08-24 DIAGNOSIS — M1990 Unspecified osteoarthritis, unspecified site: Secondary | ICD-10-CM | POA: Insufficient documentation

## 2014-08-24 HISTORY — PX: LUMBAR LAMINECTOMY/DECOMPRESSION MICRODISCECTOMY: SHX5026

## 2014-08-24 LAB — CBC WITH DIFFERENTIAL/PLATELET
Basophils Absolute: 0 10*3/uL (ref 0.0–0.1)
Basophils Relative: 0 % (ref 0–1)
Eosinophils Absolute: 0.2 10*3/uL (ref 0.0–0.7)
Eosinophils Relative: 3 % (ref 0–5)
HCT: 42.7 % (ref 36.0–46.0)
Hemoglobin: 14.5 g/dL (ref 12.0–15.0)
Lymphocytes Relative: 27 % (ref 12–46)
Lymphs Abs: 2.1 10*3/uL (ref 0.7–4.0)
MCH: 31.8 pg (ref 26.0–34.0)
MCHC: 34 g/dL (ref 30.0–36.0)
MCV: 93.6 fL (ref 78.0–100.0)
Monocytes Absolute: 0.6 10*3/uL (ref 0.1–1.0)
Monocytes Relative: 7 % (ref 3–12)
Neutro Abs: 4.9 10*3/uL (ref 1.7–7.7)
Neutrophils Relative %: 63 % (ref 43–77)
Platelets: 275 10*3/uL (ref 150–400)
RBC: 4.56 MIL/uL (ref 3.87–5.11)
RDW: 12.7 % (ref 11.5–15.5)
WBC: 7.8 10*3/uL (ref 4.0–10.5)

## 2014-08-24 LAB — BASIC METABOLIC PANEL
Anion gap: 12 (ref 5–15)
BUN: 16 mg/dL (ref 6–23)
CO2: 27 mEq/L (ref 19–32)
Calcium: 9.8 mg/dL (ref 8.4–10.5)
Chloride: 100 mEq/L (ref 96–112)
Creatinine, Ser: 0.67 mg/dL (ref 0.50–1.10)
GFR calc Af Amer: 90 mL/min — ABNORMAL LOW (ref >90)
GFR calc non Af Amer: 77 mL/min — ABNORMAL LOW (ref >90)
Glucose, Bld: 86 mg/dL (ref 70–99)
Potassium: 4.9 mEq/L (ref 3.7–5.3)
Sodium: 139 mEq/L (ref 137–147)

## 2014-08-24 LAB — TYPE AND SCREEN
ABO/RH(D): O POS
Antibody Screen: NEGATIVE

## 2014-08-24 LAB — PROTIME-INR
INR: 1.03 (ref 0.00–1.49)
Prothrombin Time: 13.6 seconds (ref 11.6–15.2)

## 2014-08-24 LAB — SURGICAL PCR SCREEN
MRSA, PCR: NEGATIVE
Staphylococcus aureus: NEGATIVE

## 2014-08-24 LAB — ABO/RH: ABO/RH(D): O POS

## 2014-08-24 SURGERY — LUMBAR LAMINECTOMY/DECOMPRESSION MICRODISCECTOMY 1 LEVEL
Anesthesia: General | Site: Back | Laterality: Left

## 2014-08-24 MED ORDER — GLYCOPYRROLATE 0.2 MG/ML IJ SOLN
INTRAMUSCULAR | Status: DC | PRN
  Administered 2014-08-24: .5 mg via INTRAVENOUS

## 2014-08-24 MED ORDER — GLYCOPYRROLATE 0.2 MG/ML IJ SOLN
INTRAMUSCULAR | Status: AC
  Filled 2014-08-24: qty 1

## 2014-08-24 MED ORDER — DEXAMETHASONE SODIUM PHOSPHATE 4 MG/ML IJ SOLN
4.0000 mg | Freq: Four times a day (QID) | INTRAMUSCULAR | Status: DC
  Filled 2014-08-24 (×4): qty 1

## 2014-08-24 MED ORDER — ONDANSETRON HCL 4 MG/2ML IJ SOLN
INTRAMUSCULAR | Status: AC
  Filled 2014-08-24: qty 2

## 2014-08-24 MED ORDER — MUPIROCIN 2 % EX OINT
TOPICAL_OINTMENT | CUTANEOUS | Status: AC
  Administered 2014-08-24: 1 via TOPICAL
  Filled 2014-08-24: qty 22

## 2014-08-24 MED ORDER — OXYCODONE-ACETAMINOPHEN 5-325 MG PO TABS
1.0000 | ORAL_TABLET | Freq: Four times a day (QID) | ORAL | Status: DC | PRN
  Administered 2014-08-25 (×2): 1 via ORAL
  Filled 2014-08-24 (×2): qty 1

## 2014-08-24 MED ORDER — OXYCODONE HCL 5 MG PO TABS: 5.0000 mg | ORAL_TABLET | Freq: Once | ORAL | Status: DC | PRN

## 2014-08-24 MED ORDER — SODIUM CHLORIDE 0.9 % IJ SOLN: 3.0000 mL | INTRAMUSCULAR | Status: DC | PRN

## 2014-08-24 MED ORDER — LIDOCAINE HCL (CARDIAC) 20 MG/ML IV SOLN
INTRAVENOUS | Status: DC | PRN
  Administered 2014-08-24: 30 mg via INTRAVENOUS

## 2014-08-24 MED ORDER — ACETAMINOPHEN 650 MG RE SUPP: 650.0000 mg | RECTAL | Status: DC | PRN

## 2014-08-24 MED ORDER — BUPIVACAINE HCL (PF) 0.25 % IJ SOLN
INTRAMUSCULAR | Status: DC | PRN
  Administered 2014-08-24: 3 mL

## 2014-08-24 MED ORDER — NEOSTIGMINE METHYLSULFATE 10 MG/10ML IV SOLN
INTRAVENOUS | Status: AC
Start: 2014-08-24 — End: 2014-08-24
  Filled 2014-08-24: qty 1

## 2014-08-24 MED ORDER — NEOSTIGMINE METHYLSULFATE 10 MG/10ML IV SOLN
INTRAVENOUS | Status: DC | PRN
  Administered 2014-08-24: 4 mg via INTRAVENOUS

## 2014-08-24 MED ORDER — ONDANSETRON HCL 4 MG/2ML IJ SOLN: 4.0000 mg | INTRAMUSCULAR | Status: DC | PRN

## 2014-08-24 MED ORDER — FENTANYL CITRATE 0.05 MG/ML IJ SOLN
INTRAMUSCULAR | Status: DC | PRN
  Administered 2014-08-24 (×4): 50 ug via INTRAVENOUS

## 2014-08-24 MED ORDER — ACETAMINOPHEN 325 MG PO TABS: 650.0000 mg | ORAL_TABLET | ORAL | Status: DC | PRN

## 2014-08-24 MED ORDER — BUDESONIDE-FORMOTEROL FUMARATE 160-4.5 MCG/ACT IN AERO
2.0000 | INHALATION_SPRAY | Freq: Every day | RESPIRATORY_TRACT | Status: DC | PRN
  Filled 2014-08-24: qty 6

## 2014-08-24 MED ORDER — DEXAMETHASONE 4 MG PO TABS
4.0000 mg | ORAL_TABLET | Freq: Four times a day (QID) | ORAL | Status: DC
  Administered 2014-08-24 – 2014-08-25 (×3): 4 mg via ORAL
  Filled 2014-08-24 (×7): qty 1

## 2014-08-24 MED ORDER — FENTANYL CITRATE 0.05 MG/ML IJ SOLN
INTRAMUSCULAR | Status: AC
Start: 2014-08-24 — End: 2014-08-24
  Filled 2014-08-24: qty 5

## 2014-08-24 MED ORDER — METOPROLOL TARTRATE 25 MG PO TABS
25.0000 mg | ORAL_TABLET | Freq: Two times a day (BID) | ORAL | Status: DC
  Administered 2014-08-24: 25 mg via ORAL
  Filled 2014-08-24 (×3): qty 2

## 2014-08-24 MED ORDER — THROMBIN 5000 UNITS EX SOLR
OROMUCOSAL | Status: DC | PRN
  Administered 2014-08-24: 14:00:00 via TOPICAL

## 2014-08-24 MED ORDER — ARTIFICIAL TEARS OP OINT
TOPICAL_OINTMENT | OPHTHALMIC | Status: DC | PRN
  Administered 2014-08-24: 1 via OPHTHALMIC

## 2014-08-24 MED ORDER — MUPIROCIN 2 % EX OINT
1.0000 "application " | TOPICAL_OINTMENT | Freq: Once | CUTANEOUS | Status: AC
  Administered 2014-08-24: 1 via TOPICAL

## 2014-08-24 MED ORDER — HYDROMORPHONE HCL 1 MG/ML IJ SOLN
0.2500 mg | INTRAMUSCULAR | Status: DC | PRN
  Administered 2014-08-24: 0.5 mg via INTRAVENOUS

## 2014-08-24 MED ORDER — LACTATED RINGERS IV SOLN
INTRAVENOUS | Status: DC | PRN
  Administered 2014-08-24 (×2): via INTRAVENOUS

## 2014-08-24 MED ORDER — LIDOCAINE HCL (CARDIAC) 20 MG/ML IV SOLN
INTRAVENOUS | Status: AC
  Filled 2014-08-24: qty 5

## 2014-08-24 MED ORDER — SODIUM CHLORIDE 0.9 % IJ SOLN: 3.0000 mL | Freq: Two times a day (BID) | INTRAMUSCULAR | Status: DC

## 2014-08-24 MED ORDER — CITALOPRAM HYDROBROMIDE 10 MG PO TABS
10.0000 mg | ORAL_TABLET | Freq: Every day | ORAL | Status: DC
  Filled 2014-08-24 (×2): qty 1

## 2014-08-24 MED ORDER — MENTHOL 3 MG MT LOZG: 1.0000 | LOZENGE | OROMUCOSAL | Status: DC | PRN

## 2014-08-24 MED ORDER — PHENYLEPHRINE HCL 10 MG/ML IJ SOLN
INTRAMUSCULAR | Status: DC | PRN
  Administered 2014-08-24: 80 ug via INTRAVENOUS
  Administered 2014-08-24 (×3): 40 ug via INTRAVENOUS

## 2014-08-24 MED ORDER — THROMBIN 5000 UNITS EX SOLR
CUTANEOUS | Status: DC | PRN
  Administered 2014-08-24 (×3): 5000 [IU] via TOPICAL

## 2014-08-24 MED ORDER — ARTIFICIAL TEARS OP OINT
TOPICAL_OINTMENT | OPHTHALMIC | Status: AC
  Filled 2014-08-24: qty 3.5

## 2014-08-24 MED ORDER — PROPOFOL 10 MG/ML IV BOLUS
INTRAVENOUS | Status: DC | PRN
  Administered 2014-08-24: 180 mg via INTRAVENOUS

## 2014-08-24 MED ORDER — ONDANSETRON HCL 4 MG/2ML IJ SOLN
INTRAMUSCULAR | Status: DC | PRN
  Administered 2014-08-24: 4 mg via INTRAVENOUS

## 2014-08-24 MED ORDER — PROPOFOL 10 MG/ML IV BOLUS
INTRAVENOUS | Status: AC
  Filled 2014-08-24: qty 20

## 2014-08-24 MED ORDER — CEFAZOLIN SODIUM 1-5 GM-% IV SOLN
1.0000 g | Freq: Three times a day (TID) | INTRAVENOUS | Status: AC
  Administered 2014-08-24 (×2): 1 g via INTRAVENOUS
  Filled 2014-08-24 (×2): qty 50

## 2014-08-24 MED ORDER — MORPHINE SULFATE 2 MG/ML IJ SOLN: 1.0000 mg | INTRAMUSCULAR | Status: DC | PRN

## 2014-08-24 MED ORDER — 0.9 % SODIUM CHLORIDE (POUR BTL) OPTIME
TOPICAL | Status: DC | PRN
  Administered 2014-08-24: 1000 mL

## 2014-08-24 MED ORDER — HYDROMORPHONE HCL 1 MG/ML IJ SOLN
INTRAMUSCULAR | Status: AC
  Filled 2014-08-24: qty 1

## 2014-08-24 MED ORDER — GLYCOPYRROLATE 0.2 MG/ML IJ SOLN
INTRAMUSCULAR | Status: AC
  Filled 2014-08-24: qty 2

## 2014-08-24 MED ORDER — EPHEDRINE SULFATE 50 MG/ML IJ SOLN
INTRAMUSCULAR | Status: DC | PRN
  Administered 2014-08-24 (×2): 5 mg via INTRAVENOUS

## 2014-08-24 MED ORDER — SODIUM CHLORIDE 0.9 % IR SOLN
Status: DC | PRN
  Administered 2014-08-24: 13:00:00

## 2014-08-24 MED ORDER — LOSARTAN POTASSIUM 50 MG PO TABS
100.0000 mg | ORAL_TABLET | Freq: Every day | ORAL | Status: DC
  Filled 2014-08-24 (×2): qty 2

## 2014-08-24 MED ORDER — ROCURONIUM BROMIDE 100 MG/10ML IV SOLN
INTRAVENOUS | Status: DC | PRN
  Administered 2014-08-24: 30 mg via INTRAVENOUS

## 2014-08-24 MED ORDER — LACTATED RINGERS IV SOLN
INTRAVENOUS | Status: DC
  Administered 2014-08-24: 11:00:00 via INTRAVENOUS

## 2014-08-24 MED ORDER — ONDANSETRON HCL 4 MG/2ML IJ SOLN: 4.0000 mg | Freq: Once | INTRAMUSCULAR | Status: DC | PRN

## 2014-08-24 MED ORDER — OXYCODONE HCL 5 MG/5ML PO SOLN: 5.0000 mg | Freq: Once | ORAL | Status: DC | PRN

## 2014-08-24 MED ORDER — PHENOL 1.4 % MT LIQD: 1.0000 | OROMUCOSAL | Status: DC | PRN

## 2014-08-24 MED ORDER — POTASSIUM CHLORIDE IN NACL 20-0.9 MEQ/L-% IV SOLN
INTRAVENOUS | Status: DC
  Filled 2014-08-24 (×3): qty 1000

## 2014-08-24 MED ORDER — HEMOSTATIC AGENTS (NO CHARGE) OPTIME
TOPICAL | Status: DC | PRN
  Administered 2014-08-24: 1 via TOPICAL

## 2014-08-24 SURGICAL SUPPLY — 46 items
BAG DECANTER FOR FLEXI CONT (MISCELLANEOUS) ×2 IMPLANT
BENZOIN TINCTURE PRP APPL 2/3 (GAUZE/BANDAGES/DRESSINGS) ×2 IMPLANT
BUR MATCHSTICK NEURO 3.0 LAGG (BURR) ×2 IMPLANT
CANISTER SUCT 3000ML (MISCELLANEOUS) ×2 IMPLANT
CONT SPEC 4OZ CLIKSEAL STRL BL (MISCELLANEOUS) ×2 IMPLANT
DRAPE LAPAROTOMY 100X72X124 (DRAPES) ×2 IMPLANT
DRAPE MICROSCOPE LEICA (MISCELLANEOUS) ×2 IMPLANT
DRAPE POUCH INSTRU U-SHP 10X18 (DRAPES) ×2 IMPLANT
DRAPE SURG 17X23 STRL (DRAPES) ×2 IMPLANT
DRSG OPSITE 4X5.5 SM (GAUZE/BANDAGES/DRESSINGS) IMPLANT
DRSG OPSITE POSTOP 3X4 (GAUZE/BANDAGES/DRESSINGS) ×2 IMPLANT
DRSG TELFA 3X8 NADH (GAUZE/BANDAGES/DRESSINGS) IMPLANT
DURAPREP 26ML APPLICATOR (WOUND CARE) ×2 IMPLANT
ELECT REM PT RETURN 9FT ADLT (ELECTROSURGICAL) ×2
ELECTRODE REM PT RTRN 9FT ADLT (ELECTROSURGICAL) ×1 IMPLANT
GAUZE SPONGE 4X4 16PLY XRAY LF (GAUZE/BANDAGES/DRESSINGS) IMPLANT
GLOVE BIO SURGEON STRL SZ8 (GLOVE) ×4 IMPLANT
GLOVE ECLIPSE 7.5 STRL STRAW (GLOVE) ×2 IMPLANT
GLOVE INDICATOR 7.0 STRL GRN (GLOVE) ×2 IMPLANT
GLOVE INDICATOR 7.5 STRL GRN (GLOVE) ×2 IMPLANT
GLOVE OPTIFIT SS 6.5 STRL BRWN (GLOVE) ×4 IMPLANT
GLOVE SS N UNI LF 8.5 STRL (GLOVE) ×2 IMPLANT
GOWN STRL REUS W/ TWL LRG LVL3 (GOWN DISPOSABLE) ×1 IMPLANT
GOWN STRL REUS W/ TWL XL LVL3 (GOWN DISPOSABLE) ×2 IMPLANT
GOWN STRL REUS W/TWL 2XL LVL3 (GOWN DISPOSABLE) IMPLANT
GOWN STRL REUS W/TWL LRG LVL3 (GOWN DISPOSABLE) ×1
GOWN STRL REUS W/TWL XL LVL3 (GOWN DISPOSABLE) ×2
HEMOSTAT POWDER KIT SURGIFOAM (HEMOSTASIS) ×2 IMPLANT
KIT BASIN OR (CUSTOM PROCEDURE TRAY) ×2 IMPLANT
KIT ROOM TURNOVER OR (KITS) ×2 IMPLANT
NEEDLE HYPO 25X1 1.5 SAFETY (NEEDLE) ×2 IMPLANT
NEEDLE SPNL 20GX3.5 QUINCKE YW (NEEDLE) IMPLANT
NS IRRIG 1000ML POUR BTL (IV SOLUTION) ×2 IMPLANT
PACK LAMINECTOMY NEURO (CUSTOM PROCEDURE TRAY) ×2 IMPLANT
PAD ARMBOARD 7.5X6 YLW CONV (MISCELLANEOUS) ×6 IMPLANT
RUBBERBAND STERILE (MISCELLANEOUS) ×4 IMPLANT
SPONGE SURGIFOAM ABS GEL SZ50 (HEMOSTASIS) ×2 IMPLANT
STRIP CLOSURE SKIN 1/2X4 (GAUZE/BANDAGES/DRESSINGS) ×2 IMPLANT
SUT VIC AB 0 CT1 18XCR BRD8 (SUTURE) ×1 IMPLANT
SUT VIC AB 0 CT1 8-18 (SUTURE) ×1
SUT VIC AB 2-0 CP2 18 (SUTURE) ×2 IMPLANT
SUT VIC AB 3-0 SH 8-18 (SUTURE) ×2 IMPLANT
SYR 20ML ECCENTRIC (SYRINGE) ×2 IMPLANT
TOWEL OR 17X24 6PK STRL BLUE (TOWEL DISPOSABLE) ×2 IMPLANT
TOWEL OR 17X26 10 PK STRL BLUE (TOWEL DISPOSABLE) ×2 IMPLANT
WATER STERILE IRR 1000ML POUR (IV SOLUTION) ×2 IMPLANT

## 2014-08-24 NOTE — Anesthesia Postprocedure Evaluation (Signed)
  Anesthesia Post-op Note  Patient: Robin ChattersMae F Sokolow  Procedure(s) Performed: Procedure(s): Re-do extraforaminal Microdiscectomy  - Lumbar three to lumbar four left (Left)  Patient Location: PACU  Anesthesia Type:General  Level of Consciousness: awake, alert  and oriented  Airway and Oxygen Therapy: Patient Spontanous Breathing and Patient connected to nasal cannula oxygen  Post-op Pain: mild  Post-op Assessment: Post-op Vital signs reviewed, Patient's Cardiovascular Status Stable, Respiratory Function Stable, Patent Airway, No signs of Nausea or vomiting and Pain level controlled  Post-op Vital Signs: stable  Last Vitals:  Filed Vitals:   08/24/14 1428  BP:   Pulse: 82  Temp:   Resp: 18    Complications: No apparent anesthesia complications

## 2014-08-24 NOTE — Anesthesia Procedure Notes (Signed)
Procedure Name: Intubation Date/Time: 08/24/2014 1:08 PM Performed by: Romie MinusOCK, Jasmyn Picha K Pre-anesthesia Checklist: Patient identified, Emergency Drugs available, Suction available, Patient being monitored and Timeout performed Patient Re-evaluated:Patient Re-evaluated prior to inductionOxygen Delivery Method: Circle system utilized Preoxygenation: Pre-oxygenation with 100% oxygen Intubation Type: IV induction Ventilation: Mask ventilation without difficulty Laryngoscope Size: Miller and 2 Grade View: Grade I Tube type: Oral Tube size: 7.0 mm Number of attempts: 1 Airway Equipment and Method: Stylet Placement Confirmation: ETT inserted through vocal cords under direct vision,  positive ETCO2,  CO2 detector and breath sounds checked- equal and bilateral Secured at: 21 cm Tube secured with: Tape Dental Injury: Teeth and Oropharynx as per pre-operative assessment

## 2014-08-24 NOTE — H&P (Signed)
Subjective: Patient is a 80 y.o. female admitted for redo left L3-4 extra foraminal microdiscectomy. Onset of symptoms was a few weeks ago, gradually worsening since that time.  The pain is rated severe, and is located at the across the lower back and radiates to the left quadricep. The pain is described as aching and occurs all day. The symptoms have been progressive. Symptoms are exacerbated by nothing in particular. MRI or CT showed recurrent left L3-4 extra foraminal herniated nucleus pulposus   Past Medical History  Diagnosis Date  . Family history of anesthesia complication     sister wakes up extremely sick  . Hypertension     takes Benicar and Metoprolol daily  . Asthma     Symbicort inhaler prn  . Cough   . Chronic back pain     HNP  . Arthritis   . GERD (gastroesophageal reflux disease)     doesn't take any meds;watching what she eats  . Hemorrhoids   . History of colon polyps   . Diverticulosis   . Urinary frequency   . History of blood transfusion     no abnormal reaction noted  . Anxiety     takes Citalopram daily  . Heart murmur     not to worry   . HOH (hard of hearing)     Past Surgical History  Procedure Laterality Date  . Cervical fusion    . Eye surgery      bil cataract  . Colonoscopy    . Lumbar laminectomy/decompression microdiscectomy Left 01/07/2013    Procedure: LEFT LUMBAR THREE TO FOUR LAMINECTOMY/DECOMPRESSION MICRODISCECTOMY;  Surgeon: Tia Alertavid S Kashaun Bebo, MD;  Location: [facility];  Service: Neurosurgery;  Laterality: Left;  LEFT LUMBAR THREE TO FOUR LAMINECTOMY/DECOMPRESSION MICRODISCECTOMY  . Colonoscopy    . Back surgery    . Joint replacement      right  . Lumbar laminectomy/decompression microdiscectomy Left 06/02/2014    Procedure: Left Lumbar Three to Four Extraforaminal Microdiskectomy;  Surgeon: Tia Alertavid S Hazeline Charnley, MD;  Location: [facility];  Service: Neurosurgery;  Laterality: Left;    Prior to Admission medications   Medication Sig Start  Date End Date Taking? Authorizing Provider  budesonide-formoterol (SYMBICORT) 160-4.5 MCG/ACT inhaler Inhale 2 puffs into the lungs daily as needed (for wheezing).   Yes Historical Provider, MD  Cholecalciferol (VITAMIN D3) 2000 UNITS capsule Take 2,000 Units by mouth daily.   Yes Historical Provider, MD  citalopram (CELEXA) 20 MG tablet Take 10 mg by mouth daily.   Yes Historical Provider, MD  losartan (COZAAR) 100 MG tablet Take 100 mg by mouth daily.   Yes Historical Provider, MD  metoprolol (LOPRESSOR) 50 MG tablet Take 25-50 mg by mouth 2 (two) times daily. 50 mg in the am and 25 mg in the evening   Yes Historical Provider, MD  Multiple Vitamin (MULTIVITAMIN WITH MINERALS) TABS Take 1 tablet by mouth daily.   Yes Historical Provider, MD  Omega-3 Fatty Acids (FISH OIL) 1200 MG CAPS Take 1,200 mg by mouth daily.   Yes Historical Provider, MD  oxyCODONE-acetaminophen (PERCOCET/ROXICET) 5-325 MG per tablet Take 1 tablet by mouth every 6 (six) hours as needed for moderate pain. 06/02/14  Yes Tia Alertavid S Esiah Bazinet, MD  citalopram (CELEXA) 10 MG tablet Take 10 mg by mouth daily.    Historical Provider, MD   No Known Allergies  History  Substance Use Topics  . Smoking status: Never Smoker   . Smokeless tobacco: Not on file  .  Alcohol Use: No    Family History  Problem Relation Age of Onset  . Cancer - Cervical Sister      Review of Systems  Positive ROS: Negative  All other systems have been reviewed and were otherwise negative with the exception of those mentioned in the HPI and as above.  Objective: Vital signs in last 24 hours: Temp:  [97.3 F (36.3 C)] 97.3 F (36.3 C) (11/25 1047) Pulse Rate:  [66-67] 67 (11/25 1048) Resp:  [20] 20 (11/25 1047) SpO2:  [100 %] 100 % (11/25 1048) Weight:  [140 lb (63.504 kg)] 140 lb (63.504 kg) (11/25 1047)  General Appearance: Alert, cooperative, no distress, appears stated age Head: Normocephalic, without obvious abnormality, atraumatic Eyes: PERRL,  conjunctiva/corneas clear, EOM's intact    Neck: Supple, symmetrical, trachea midline Back: Symmetric, no curvature, ROM normal, no CVA tenderness Lungs:  respirations unlabored Heart: Regular rate and rhythm Abdomen: Soft, non-tender Extremities: Extremities normal, atraumatic, no cyanosis or edema Pulses: 2+ and symmetric all extremities Skin: Skin color, texture, turgor normal, no rashes or lesions  NEUROLOGIC:   Mental status: Alert and oriented x4,  no aphasia, good attention span, fund of knowledge, and memory Motor Exam - grossly normal Sensory Exam - grossly normal Reflexes: 1+ Coordination - grossly normal Gait - grossly normal Balance - grossly normal Cranial Nerves: I: smell Not tested  II: visual acuity  OS: nl    OD: nl  II: visual fields Full to confrontation  II: pupils Equal, round, reactive to light  III,VII: ptosis None  III,IV,VI: extraocular muscles  Full ROM  V: mastication Normal  V: facial light touch sensation  Normal  V,VII: corneal reflex  Present  VII: facial muscle function - upper  Normal  VII: facial muscle function - lower Normal  VIII: hearing Not tested  IX: soft palate elevation  Normal  IX,X: gag reflex Present  XI: trapezius strength  5/5  XI: sternocleidomastoid strength 5/5  XI: neck flexion strength  5/5  XII: tongue strength  Normal    Data Review Lab Results  Component Value Date   WBC 7.8 08/24/2014   HGB 14.5 08/24/2014   HCT 42.7 08/24/2014   MCV 93.6 08/24/2014   PLT 275 08/24/2014   Lab Results  Component Value Date   NA 142 05/30/2014   K 5.6* 05/30/2014   CL 105 05/30/2014   CO2 27 05/30/2014   BUN 19 05/30/2014   CREATININE 0.75 05/30/2014   GLUCOSE 83 05/30/2014   Lab Results  Component Value Date   INR 0.99 05/30/2014    Assessment/Plan: Patient admitted for redo left L3-4 extremity microdiscectomy. There is a possibility this could be converted into a fusion.. Patient has failed a reasonable attempt  at conservative therapy.  I explained the condition and procedure to the patient and answered any questions.  Patient wishes to proceed with procedure as planned. Understands risks/ benefits and typical outcomes of procedure.   Riti Rollyson S 08/24/2014 12:35 PM

## 2014-08-24 NOTE — Plan of Care (Signed)
Problem: Consults Goal: Diagnosis - Spinal Surgery Outcome: Completed/Met Date Met:  08/24/14 Lumbar Laminectomy (Complex)

## 2014-08-24 NOTE — Plan of Care (Signed)
Problem: Consults Goal: Spinal Surgery Patient Education See Patient Education Module for education specifics. Outcome: Completed/Met Date Met:  08/24/14     

## 2014-08-24 NOTE — Addendum Note (Signed)
Addendum  created 08/24/14 1521 by Kipp Broodavid Chistopher Mangino, MD   Modules edited: Orders, PRL Based Order Sets

## 2014-08-24 NOTE — Op Note (Signed)
08/24/2014  2:21 PM  PATIENT:  Robin Patterson  80 y.o. female  PRE-OPERATIVE DIAGNOSIS:  Recurrent left L3-4 extraforaminal herniated nuclear pulses with a left L3 radiculopathy  POST-OPERATIVE DIAGNOSIS:  Same  PROCEDURE:  Redo left L3-4 extra foraminal microdiscectomy  SURGEON:  Marikay Alaravid Lev Cervone, MD  ASSISTANTS: Dr. Wynetta Emerycram  ANESTHESIA:   General  EBL: Less than 25 ml  Total I/O In: 1000 [I.V.:1000] Out: -   BLOOD ADMINISTERED:none  DRAINS: None   SPECIMEN:  No Specimen  INDICATION FOR PROCEDURE: This patient is status post left L3-4 extremity microdiscectomy. She presented with recurrent pain. MRI showed a large recurrent disc herniation L3-4 and extra foraminal space. I recommended a redo extra foraminal microdiscectomy. Patient understood the risks, benefits, and alternatives and potential outcomes and wished to proceed.  PROCEDURE DETAILS: The patient was taken to the operating room and after induction of adequate generalized endotracheal anesthesia, the patient was rolled into the prone position on the Wilson frame and all pressure points were padded. The lumbar region was cleaned and then prepped with DuraPrep and draped in the usual sterile fashion. 5 cc of local anesthesia was injected and then a dorsal midline incision was made and carried down to the lumbo sacral fascia. The fascia was opened and the paraspinous musculature was taken down in a subperiosteal fashion to expose L3-4 extra foraminal space on the left. Intraoperative x-ray confirmed my level, and then I used a Kerrison punch to widen the extraforaminal decompression. The left L3 nerve root was identified. We then gently retracted the nerve root,  coagulated the epidural venous vasculature, and performed a thorough intradiscal discectomy with pituitary rongeurs. I worked up under the L3 nerve root and found several large superior fragments that were removed with a nerve hook and a pituitary rongeur. Within the axilla  of the disc space. I then palpated with a coronary dilator along the nerve root and into the foramen to assure adequate decompression. I felt no more compression of the nerve root. I irrigated with saline solution containing bacitracin. Achieved hemostasis with bipolar cautery, lined the dura with Gelfoam, and then closed the fascia with 0 Vicryl. I closed the subcutaneous tissues with 2-0 Vicryl and the subcuticular tissues with 3-0 Vicryl. The skin was then closed with benzoin and Steri-Strips. The drapes were removed, a sterile dressing was applied. The patient was awakened from general anesthesia and transferred to the recovery room in stable condition. At the end of the procedure all sponge, needle and instrument counts were correct.   PLAN OF CARE: Admit to inpatient   PATIENT DISPOSITION:  PACU - hemodynamically stable.   Delay start of Pharmacological VTE agent (>24hrs) due to surgical blood loss or risk of bleeding:  yes

## 2014-08-24 NOTE — Progress Notes (Signed)
Doing very well, no leg pain, back sore, walked to BR

## 2014-08-24 NOTE — Anesthesia Preprocedure Evaluation (Signed)
Anesthesia Evaluation  Patient identified by MRN, date of birth, ID band Patient awake    Reviewed: Allergy & Precautions, H&P , NPO status , Patient's Chart, lab work & pertinent test results  Airway Mallampati: II  TM Distance: >3 FB Neck ROM: Full    Dental  (+) Teeth Intact, Dental Advisory Given   Pulmonary  breath sounds clear to auscultation        Cardiovascular hypertension, Rhythm:Regular Rate:Normal     Neuro/Psych    GI/Hepatic   Endo/Other    Renal/GU      Musculoskeletal   Abdominal   Peds  Hematology   Anesthesia Other Findings   Reproductive/Obstetrics                             Anesthesia Physical Anesthesia Plan  ASA: III  Anesthesia Plan: General   Post-op Pain Management:    Induction: Intravenous  Airway Management Planned: Oral ETT  Additional Equipment:   Intra-op Plan:   Post-operative Plan: Extubation in OR  Informed Consent: I have reviewed the patients History and Physical, chart, labs and discussed the procedure including the risks, benefits and alternatives for the proposed anesthesia with the patient or authorized representative who has indicated his/her understanding and acceptance.   Dental advisory given  Plan Discussed with: CRNA and Anesthesiologist  Anesthesia Plan Comments:         Anesthesia Quick Evaluation

## 2014-08-24 NOTE — Transfer of Care (Signed)
Immediate Anesthesia Transfer of Care Note  Patient: Robin Patterson  Procedure(s) Performed: Procedure(s): Re-do extraforaminal Microdiscectomy  - Lumbar three to lumbar four left (Left)  Patient Location: PACU  Anesthesia Type:General  Level of Consciousness: awake, oriented and patient cooperative  Airway & Oxygen Therapy: Patient Spontanous Breathing and Patient connected to nasal cannula oxygen  Post-op Assessment: Report given to PACU RN and Post -op Vital signs reviewed and stable  Post vital signs: Reviewed  Complications: No apparent anesthesia complications

## 2014-08-25 DIAGNOSIS — M5116 Intervertebral disc disorders with radiculopathy, lumbar region: Secondary | ICD-10-CM | POA: Diagnosis not present

## 2014-08-25 MED ORDER — OXYCODONE-ACETAMINOPHEN 5-325 MG PO TABS: 1.0000 | ORAL_TABLET | Freq: Four times a day (QID) | ORAL | Status: DC | PRN

## 2014-08-25 NOTE — Discharge Summary (Signed)
Physician Discharge Summary  Patient ID: Robin Patterson MRN: [id number] DOB/AGE: 78/01/1926 78 y.o.  Admit date: 08/24/2014 Discharge date: 08/25/2014  Admission Diagnoses: Recurrent L3-4 extraforaminal herniated disc   Discharge Diagnoses: Same   Discharged Condition: good  Hospital Course: The patient was admitted on 08/24/2014 and taken to the operating room where the patient underwent redo left L3-4 extraforaminal microdiscectomy. The patient tolerated the procedure well and was taken to the recovery room and then to the floor in stable condition. The hospital course was routine. There were no complications. The wound remained clean dry and intact. Pt had appropriate back soreness. No complaints of leg pain or new N/T/W. The patient remained afebrile with stable vital signs, and tolerated a regular diet. The patient continued to increase activities, and pain was well controlled with oral pain medications.   Consults: None  Significant Diagnostic Studies:  Results for orders placed or performed during the hospital encounter of 08/24/14  Surgical pcr screen  Result Value Ref Range   MRSA, PCR NEGATIVE NEGATIVE   Staphylococcus aureus NEGATIVE NEGATIVE  CBC WITH DIFFERENTIAL  Result Value Ref Range   WBC 7.8 4.0 - 10.5 K/uL   RBC 4.56 3.87 - 5.11 MIL/uL   Hemoglobin 14.5 12.0 - 15.0 g/dL   HCT 27.242.7 53.636.0 - 64.446.0 %   MCV 93.6 78.0 - 100.0 fL   MCH 31.8 26.0 - 34.0 pg   MCHC 34.0 30.0 - 36.0 g/dL   RDW 03.412.7 74.211.5 - 59.515.5 %   Platelets 275 150 - 400 K/uL   Neutrophils Relative % 63 43 - 77 %   Neutro Abs 4.9 1.7 - 7.7 K/uL   Lymphocytes Relative 27 12 - 46 %   Lymphs Abs 2.1 0.7 - 4.0 K/uL   Monocytes Relative 7 3 - 12 %   Monocytes Absolute 0.6 0.1 - 1.0 K/uL   Eosinophils Relative 3 0 - 5 %   Eosinophils Absolute 0.2 0.0 - 0.7 K/uL   Basophils Relative 0 0 - 1 %   Basophils Absolute 0.0 0.0 - 0.1 K/uL  Basic metabolic panel  Result Value Ref Range   Sodium 139 137 - 147  mEq/L   Potassium 4.9 3.7 - 5.3 mEq/L   Chloride 100 96 - 112 mEq/L   CO2 27 19 - 32 mEq/L   Glucose, Bld 86 70 - 99 mg/dL   BUN 16 6 - 23 mg/dL   Creatinine, Ser 6.380.67 0.50 - 1.10 mg/dL   Calcium 9.8 8.4 - 75.610.5 mg/dL   GFR calc non Af Amer 77 (L) >90 mL/min   GFR calc Af Amer 90 (L) >90 mL/min   Anion gap 12 5 - 15  Protime-INR  Result Value Ref Range   Prothrombin Time 13.6 11.6 - 15.2 seconds   INR 1.03 0.00 - 1.49  Type and screen  Result Value Ref Range   ABO/RH(D) O POS    Antibody Screen NEG    Sample Expiration 08/27/2014   ABO/Rh  Result Value Ref Range   ABO/RH(D) O POS     Dg Lumbar Spine 1 View  08/24/2014   CLINICAL DATA:  L3-L4 microdiscectomy, HNP  EXAM: LUMBAR SPINE - 1 VIEW  COMPARISON:  Portable cross-table lateral intraoperative image at 1333 hr correlated with prior MRI lumbar spine 07/28/2014  FINDINGS: Prior MRI labeled with 5 lumbar vertebrae and a transitional S1 segment, current exam labeled accordingly.  Metallic probe via dorsal approach projects dorsal to the L3-L4 disc space.  Disc  space narrowing at L4-L5 and L1-L2 noted.  Grade 1 anterolisthesis L5-S1.  Osseous demineralization.  IMPRESSION: Posterior intraoperative localization of the L3-L4 disc space level.   Electronically Signed   By: Ulyses SouthwardMark  Boles M.D.   On: 08/24/2014 14:57    Antibiotics:  Anti-infectives    Start     Dose/Rate Route Frequency Ordered Stop   08/24/14 1645  ceFAZolin (ANCEF) IVPB 1 g/50 mL premix     1 g100 mL/hr over 30 Minutes Intravenous Every 8 hours 08/24/14 1632 08/25/14 0025   08/24/14 1241  bacitracin 50,000 Units in sodium chloride irrigation 0.9 % 500 mL irrigation  Status:  Discontinued       As needed 08/24/14 1331 08/24/14 1421   08/24/14 0600  ceFAZolin (ANCEF) IVPB 2 g/50 mL premix     2 g100 mL/hr over 30 Minutes Intravenous On call to O.R. 08/23/14 1342 08/24/14 1308      Discharge Exam: Blood pressure 127/79, pulse 77, temperature 98.6 F (37 C),  temperature source Oral, resp. rate 16, height 5\' 2"  (1.575 m), weight 140 lb (63.504 kg), SpO2 95 %. Physical exam is normal. Good strength, walking in the halls Incision clean dry and intact  Discharge Medications:     Medication List    TAKE these medications        budesonide-formoterol 160-4.5 MCG/ACT inhaler  Commonly known as:  SYMBICORT  Inhale 2 puffs into the lungs daily as needed (for wheezing).     citalopram 20 MG tablet  Commonly known as:  CELEXA  Take 10 mg by mouth daily.     citalopram 10 MG tablet  Commonly known as:  CELEXA  Take 10 mg by mouth daily.     Fish Oil 1200 MG Caps  Take 1,200 mg by mouth daily.     losartan 100 MG tablet  Commonly known as:  COZAAR  Take 100 mg by mouth daily.     metoprolol 50 MG tablet  Commonly known as:  LOPRESSOR  Take 25-50 mg by mouth 2 (two) times daily. 50 mg in the am and 25 mg in the evening     multivitamin with minerals Tabs tablet  Take 1 tablet by mouth daily.     oxyCODONE-acetaminophen 5-325 MG per tablet  Commonly known as:  PERCOCET/ROXICET  Take 1 tablet by mouth every 6 (six) hours as needed for moderate pain.     Vitamin D3 2000 UNITS capsule  Take 2,000 Units by mouth daily.        Disposition: Home   Final Dx: Redo left L3-4 extraforaminal microdiscectomy      Discharge Instructions     Remove dressing in 72 hours    Complete by:  As directed      Call MD for:  difficulty breathing, headache or visual disturbances    Complete by:  As directed      Call MD for:  persistant nausea and vomiting    Complete by:  As directed      Call MD for:  redness, tenderness, or signs of infection (pain, swelling, redness, odor or green/yellow discharge around incision site)    Complete by:  As directed      Call MD for:  severe uncontrolled pain    Complete by:  As directed      Call MD for:  temperature >100.4    Complete by:  As directed      Diet - low sodium heart healthy    Complete  by:  As  directed      Discharge instructions    Complete by:  As directed   No strenuous activity, no heavy lifting     Increase activity slowly    Complete by:  As directed            Follow-up Information    Follow up with Elinda Bunten S, MD. Schedule an appointment as soon as possible for a visit in 3 weeks.   Specialty:  Neurosurgery   Contact information:   [address] [place] [address] [phone]        Signed: Tia Alert 08/25/2014, 9:15 AM

## 2014-08-25 NOTE — Progress Notes (Signed)
Pt doing well. Pt and daughter given D/C instructions with Rx, verbal understanding was provided. Pt's incision is covered with Honeycomb and is clean and dry with no sign of infection. Pt's IV was removed prior to D/C. Pt D/C'd home via wheelchair @ 0940 per MD order. Pt is stable @ D/C and has no other needs at this time. Rema FendtAshley Deveion Denz, RN

## 2014-08-25 NOTE — Discharge Instructions (Signed)
Wound Care °Keep incision covered and dry for one week.  If you shower prior to then, cover incision with plastic wrap.  °You may remove outer bandage after one week and shower.  °Do not put any creams, lotions, or ointments on incision. °Leave steri-strips on neck.  They will fall off by themselves. °Activity °Walk each and every day, increasing distance each day. °No lifting greater than 5 lbs.  Avoid bending, arching, or twisting. °No driving for 2 weeks; may ride as a passenger locally. °If provided with back brace, wear when out of bed.  It is not necessary to wear in bed. °Diet °Resume your normal diet.  °Return to Work °Will be discussed at you follow up appointment. °Call Your Doctor If Any of These Occur °Redness, drainage, or swelling at the wound.  °Temperature greater than 101 degrees. °Severe pain not relieved by pain medication. °Incision starts to come apart. °Follow Up Appt °Call today for appointment in 1-2 weeks ([phone]) or for problems.  If you have any hardware placed in your spine, you will need an x-ray before your appointment. ° ° °Laminectomy - Laminotomy - Discectomy °Your surgeon has decided that a laminectomy (entire lamina removal) or laminotomy (partial lamina removal) is the best treatment for your back problem. These procedures involve removal of bone to relieve pressure on nerve roots. It allows the surgeon access to parts of the spine where other problems are located. This could be an injured disc (the cartilage-like structures located between the bones of the back). In this surgery your surgeon removes a part of the boney arch that surrounds your spinal canal. This may be compressing nerve roots. In some cases, the surgeon will remove the disc and fuse (stick together) vertebral bodies (the bones of your back) to make the spine more stable. The type of procedure you will need is usually decided prior to surgery, however modifications may be necessary. The time in surgery depends  on the findings in surgery and the procedure necessary to correct the problems. °DISCECTOMY °For people with disc problems, the surgeon removes the portion of the disc that is causing the pressure on the nerve root. Some surgeons perform a micro (small) discectomy, which may require removal of only a small portion of the lamina. A disc nucleus (center) may also be removed either through a needle (percutaneous discectomy) or by injecting an enzyme called chymopapain into the disc. Chymopapain is an enzyme that dissolves the disc. For people with back instability, the surgeon fuses vertebrae that are next to each other with tiny pieces of bone. These are used as bone grafts on the facets, or between the vertebrae. When this heals, the bones will no longer be able to move. These bone chips are often taken from the pelvic bones. Bones and bone grafts grow into one unit, stabilizing the segments of the spinal column. °LET YOUR CAREGIVER KNOW ABOUT: °· Allergies. °· Medicines taken including herbs, eyedrops, over-the-counter medicines, and creams. °· Use of steroids (by mouth or creams). °· Previous problems with anesthetics or numbing medicine. °· Possibility of pregnancy, if this applies. °· History of blood clots (thrombophlebitis). °· History of bleeding or blood problems. °· Previous surgery. °· Other health problems. °RISKS AND COMPLICATIONS °Your caregiver will discuss possible risks and complications with you before surgery. In addition to the usual risks of anesthesia, other common risks and complications include: °· Blood loss and replacement. °· Temporary increase in pain due to surgery. °· Uncorrected back pain. °· Infection. °·   New nerve damage (tingling, numbness, and pain). °BEFORE THE PROCEDURE °· Stop smoking at least 1 week prior to surgery. This lowers risk during surgery. °· Your caregiver may advise that you stop taking certain medicines that may affect the outcome of the surgery and your ability to  heal. For example, you may need to stop taking anti-inflammatories, such as aspirin, because of possible bleeding problems. Other medicines may have interactions with anesthesia. °· Tell your caregiver if you have been on steroids for long periods of time. Often, additional steroids are administered intravenously before and during the procedure to prevent complications. °· You should be present 60 minutes prior to your procedure or as directed. °AFTER THE PROCEDURE °After surgery, you will be taken to the recovery area where a nurse will watch and check your progress. Generally, you will be allowed to go home within 1 week barring other problems. °HOME CARE INSTRUCTIONS  °· Check the surgical cut (incision) twice a day for signs of infection. Some signs may include a bad smelling, greenish or yellowish discharge from the wound; increased pain or increased redness over the incision site; an opening of the incision; flu-like symptoms; or a temperature above 101.5° F (38.6° C). °· Change your bandages in about 24 to 36 hours following surgery or as directed. °· You may shower once the bandage is removed or as directed. Avoid bathtubs, swimming pools, and hot tubs for 3 weeks or until your incision has healed completely. If you have stitches (sutures) or staples they may be removed 2 to 3 weeks after surgery, or as directed by your caregiver. °· Follow your caregiver's instructions for activities, exercises, and physical therapy. °· Weight reduction may be beneficial if you are overweight. °· Walking is permitted. You may use a treadmill without an incline. Cut down on activities if you have discomfort. You may also go up and down stairs as tolerated. °· Do not lift anything heavier than 10 to 15 pounds. Avoid bending or twisting at the waist. Always bend your knees. °· Maintain strength and range of motion as instructed. °· No driving is permitted for 2 to 3 weeks, or as directed by your caregiver. You may be a  passenger for 20 to 30 minute trips. Lying back in the passenger seat may be more comfortable for you. °· Limit your sitting to 20 to 30 minute intervals. You should lie down or walk in between sitting periods. There are no limitations for sitting in a recliner chair. °· Only take over-the-counter or prescription medicines for pain, discomfort, or fever as directed by your caregiver. °SEEK MEDICAL CARE IF:  °· There is increased bleeding (more than a small spot) from the wound. °· You notice redness, swelling, or increasing pain in the wound. °· Pus is coming from the wound. °· An unexplained oral temperature above 102° F (38.9° C) develops. °· You notice a bad smell coming from the wound or dressing. °SEEK IMMEDIATE MEDICAL CARE IF:  °· You develop a rash. °· You have difficulty breathing. °· You have any allergic problems. °Document Released: 09/13/2000 Document Revised: 01/31/2014 Document Reviewed: 07/12/2008 °[employer]® Patient Information ©2015 [employer]. This information is not intended to replace advice given to you by your health care provider. Make sure you discuss any questions you have with your health care provider. ° °

## 2014-08-29 ENCOUNTER — Encounter (HOSPITAL_COMMUNITY): Payer: Self-pay | Admitting: Neurological Surgery

## 2016-01-20 IMAGING — CR DG LUMBAR SPINE 1V
1 series · 1 of 1 positions shown · non-contrast
Comparison: Lumbar spine MRI dated 04/29/2014

CLINICAL DATA: Left laminectomy L3-4

EXAM:
LUMBAR SPINE - 1 VIEW

[lat]
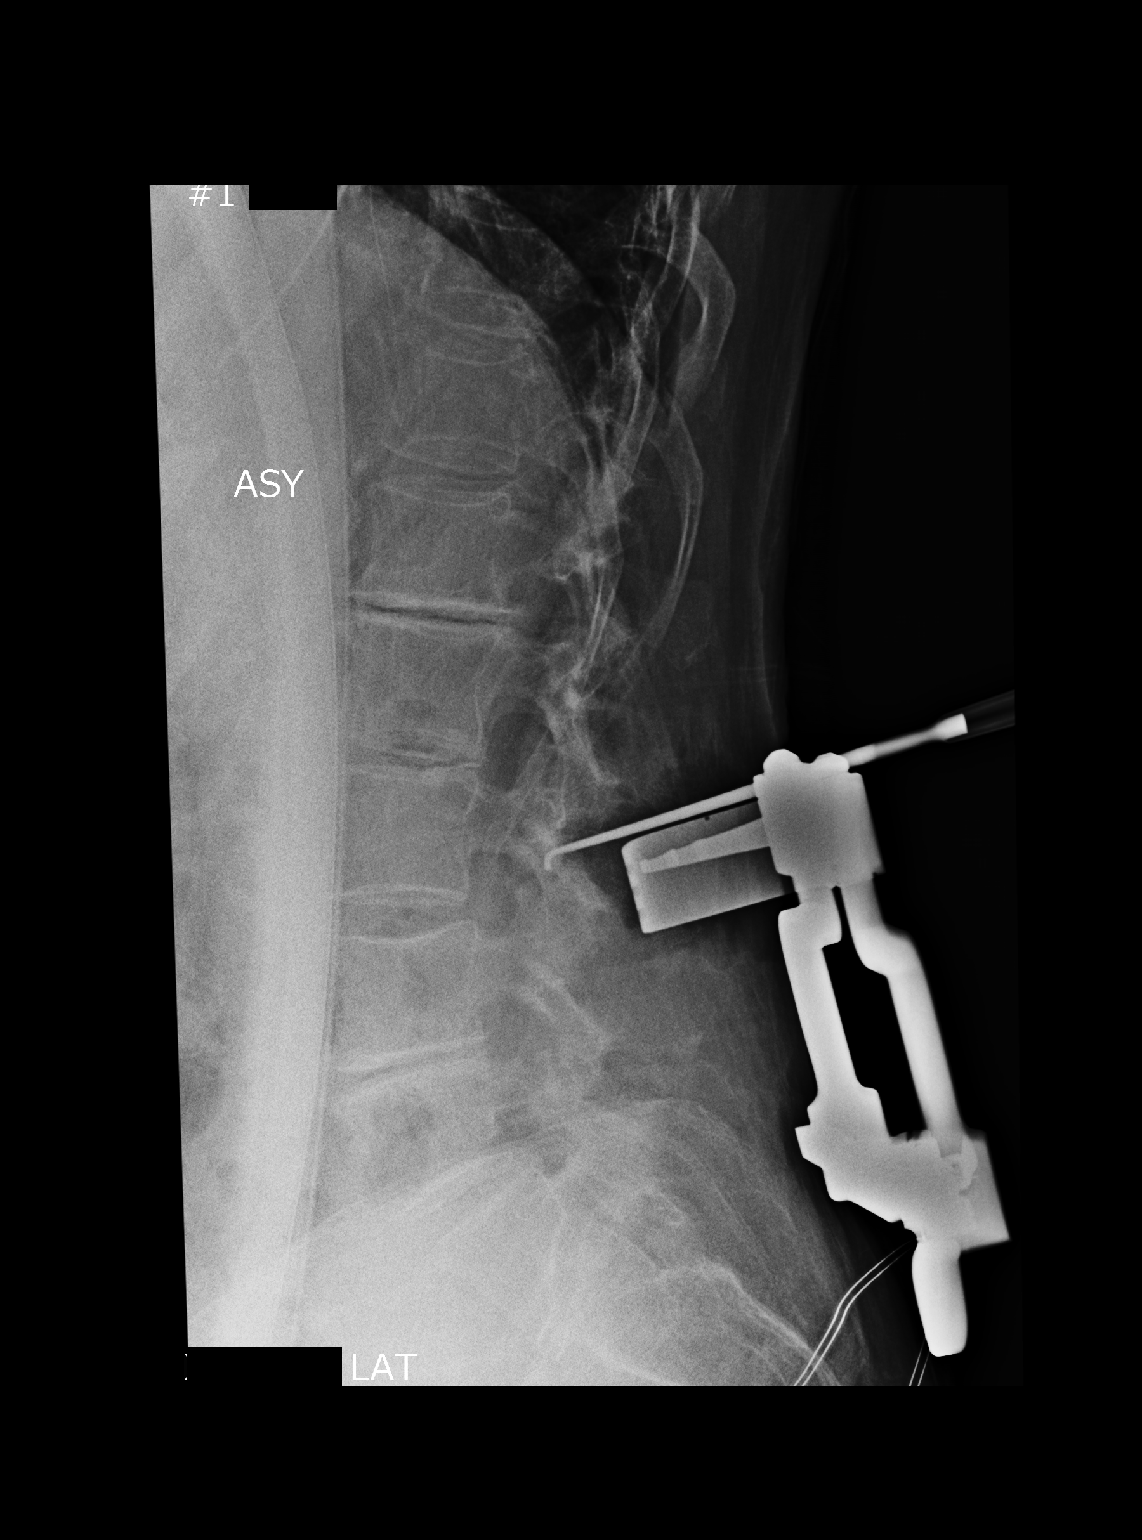

[1 of 1 positions shown; findings below may reference images not displayed]

FINDINGS: Lumbar spine is numbered in concordance with prior MRI.

Surgical probe and hardware at L3-4.
IMPRESSION: Lumbar levels as above.

## 2017-10-31 DIAGNOSIS — I639 Cerebral infarction, unspecified: Secondary | ICD-10-CM

## 2017-10-31 HISTORY — DX: Cerebral infarction, unspecified: I63.9

## 2020-11-15 ENCOUNTER — Other Ambulatory Visit: Payer: Self-pay | Admitting: Neurological Surgery

## 2020-11-15 DIAGNOSIS — M7130 Other bursal cyst, unspecified site: Secondary | ICD-10-CM

## 2020-11-22 NOTE — Progress Notes (Signed)
Surgical Instructions    Your procedure is scheduled on Monday, February 28th.  Report to [facility] Entrance "A" at 12:40 A.M., then check in with the Admitting office.  Call this number if you have problems the morning of surgery:  [phone]   If you have any questions prior to your surgery date call [phone]: Open Monday-Friday 8am-4pm    Remember:  Do not eat or drink after midnight the night before your surgery     Take these medicines the morning of surgery with A SIP OF WATER   Tylenol - if needed  Amlodipine (Norvasc)  Atorvastatin (Lipitor)  Symbicort Inhaler - if needed (bring with you on day of surgery)  Citalopram (Celexa)  Esomeprazole (Nexium)   Metoprolol Succinate (Toprol-XL)  Follow your surgeon's instructions on when to stop Aspirin & Plavix.  If no instructions were given by your surgeon then you will need to call the office to get those instructions.    As of today, STOP taking Aleve, Naproxen, Ibuprofen, Motrin, Advil, Goody's, BC's, all herbal medications, fish oil, and all vitamins.                     Do not wear jewelry, make up, or nail polish            Do not wear lotions, powders, perfumes, or deodorant.            Do not shave 48 hours prior to surgery.              Do not bring valuables to the hospital.            [facility] is not responsible for any belongings or valuables.  Do NOT Smoke (Tobacco/Vaping) or drink Alcohol 24 hours prior to your procedure If you use a CPAP at night, you may bring all equipment for your overnight stay.   Contacts, glasses, dentures or bridgework may not be worn into surgery, please bring cases for these belongings   For patients admitted to the hospital, discharge time will be determined by your treatment team.   Patients discharged the day of surgery will not be allowed to drive home, and someone needs to stay with them for 24 hours.    Special instructions:   [place]- Preparing For  Surgery  Before surgery, you can play an important role. Because skin is not sterile, your skin needs to be as free of germs as possible. You can reduce the number of germs on your skin by washing with CHG (chlorahexidine gluconate) Soap before surgery.  CHG is an antiseptic cleaner which kills germs and bonds with the skin to continue killing germs even after washing.    Oral Hygiene is also important to reduce your risk of infection.  Remember - BRUSH YOUR TEETH THE MORNING OF SURGERY WITH YOUR REGULAR TOOTHPASTE  Please do not use if you have an allergy to CHG or antibacterial soaps. If your skin becomes reddened/irritated stop using the CHG.  Do not shave (including legs and underarms) for at least 48 hours prior to first CHG shower. It is OK to shave your face.  Please follow these instructions carefully.   1. Shower the NIGHT BEFORE SURGERY and the MORNING OF SURGERY  2. If you chose to wash your hair, wash your hair first as usual with your normal shampoo.  3. After you shampoo, rinse your hair and body thoroughly to remove the shampoo.  4. Wash Face and genitals (  private parts) with your normal soap.   5.  Shower the NIGHT BEFORE SURGERY and the MORNING OF SURGERY with CHG Soap.   6. Use CHG Soap as you would any other liquid soap. You can apply CHG directly to the skin and wash gently with a scrungie or a clean washcloth.   7. Apply the CHG Soap to your body ONLY FROM THE NECK DOWN.  Do not use on open wounds or open sores. Avoid contact with your eyes, ears, mouth and genitals (private parts). Wash Face and genitals (private parts)  with your normal soap.   8. Wash thoroughly, paying special attention to the area where your surgery will be performed.  9. Thoroughly rinse your body with warm water from the neck down.  10. DO NOT shower/wash with your normal soap after using and rinsing off the CHG Soap.  11. Pat yourself dry with a CLEAN TOWEL.  12. Wear CLEAN PAJAMAS to bed  the night before surgery  13. Place CLEAN SHEETS on your bed the night before your surgery  14. DO NOT SLEEP WITH PETS.   Day of Surgery: Wear Clean/Comfortable clothing the morning of surgery Do not apply any deodorants/lotions.   Remember to brush your teeth WITH YOUR REGULAR TOOTHPASTE.   Please read over the following fact sheets that you were given.

## 2020-11-23 ENCOUNTER — Other Ambulatory Visit (HOSPITAL_COMMUNITY)
Admission: RE | Admit: 2020-11-23 | Discharge: 2020-11-23 | Disposition: A | Discharge status: Home / Self Care | Payer: Medicare Other | Source: Ambulatory Visit | Attending: Neurological Surgery | Admitting: Neurological Surgery

## 2020-11-23 ENCOUNTER — Encounter (HOSPITAL_COMMUNITY): Payer: Self-pay

## 2020-11-23 ENCOUNTER — Other Ambulatory Visit (HOSPITAL_COMMUNITY): Payer: Self-pay

## 2020-11-23 ENCOUNTER — Other Ambulatory Visit: Payer: Self-pay

## 2020-11-23 ENCOUNTER — Ambulatory Visit (HOSPITAL_COMMUNITY)
Admission: RE | Admit: 2020-11-23 | Discharge: 2020-11-23 | Disposition: A | Discharge status: Home / Self Care | Payer: Medicare Other | Source: Ambulatory Visit | Attending: Neurological Surgery | Admitting: Neurological Surgery

## 2020-11-23 ENCOUNTER — Encounter (HOSPITAL_COMMUNITY)
Admission: RE | Admit: 2020-11-23 | Discharge: 2020-11-23 | Disposition: A | Discharge status: Home / Self Care | Payer: Medicare Other | Source: Ambulatory Visit | Attending: Neurological Surgery | Admitting: Neurological Surgery

## 2020-11-23 DIAGNOSIS — U071 COVID-19: Secondary | ICD-10-CM | POA: Diagnosis not present

## 2020-11-23 DIAGNOSIS — Z01818 Encounter for other preprocedural examination: Secondary | ICD-10-CM | POA: Insufficient documentation

## 2020-11-23 DIAGNOSIS — M7130 Other bursal cyst, unspecified site: Secondary | ICD-10-CM | POA: Diagnosis not present

## 2020-11-23 LAB — BASIC METABOLIC PANEL
Anion gap: 9 (ref 5–15)
BUN: 31 mg/dL — ABNORMAL HIGH (ref 8–23)
CO2: 23 mmol/L (ref 22–32)
Calcium: 9.2 mg/dL (ref 8.9–10.3)
Chloride: 107 mmol/L (ref 98–111)
Creatinine, Ser: 1.17 mg/dL — ABNORMAL HIGH (ref 0.44–1.00)
GFR, Estimated: 44 mL/min — ABNORMAL LOW (ref >60)
Glucose, Bld: 86 mg/dL (ref 70–99)
Potassium: 4.6 mmol/L (ref 3.5–5.1)
Sodium: 139 mmol/L (ref 135–145)

## 2020-11-23 LAB — CBC WITH DIFFERENTIAL/PLATELET
Abs Immature Granulocytes: 0.02 10*3/uL (ref 0.00–0.07)
Basophils Absolute: 0 10*3/uL (ref 0.0–0.1)
Basophils Relative: 1 %
Eosinophils Absolute: 0.2 10*3/uL (ref 0.0–0.5)
Eosinophils Relative: 3 %
HCT: 38.2 % (ref 36.0–46.0)
Hemoglobin: 12.7 g/dL (ref 12.0–15.0)
Immature Granulocytes: 0 %
Lymphocytes Relative: 33 %
Lymphs Abs: 1.9 10*3/uL (ref 0.7–4.0)
MCH: 31.5 pg (ref 26.0–34.0)
MCHC: 33.2 g/dL (ref 30.0–36.0)
MCV: 94.8 fL (ref 80.0–100.0)
Monocytes Absolute: 0.5 10*3/uL (ref 0.1–1.0)
Monocytes Relative: 9 %
Neutro Abs: 3.1 10*3/uL (ref 1.7–7.7)
Neutrophils Relative %: 54 %
Platelets: 320 10*3/uL (ref 150–400)
RBC: 4.03 MIL/uL (ref 3.87–5.11)
RDW: 13.2 % (ref 11.5–15.5)
WBC: 5.7 10*3/uL (ref 4.0–10.5)
nRBC: 0 % (ref 0.0–0.2)

## 2020-11-23 LAB — PROTIME-INR
INR: 1.1 (ref 0.8–1.2)
Prothrombin Time: 13.8 seconds (ref 11.4–15.2)

## 2020-11-23 LAB — SURGICAL PCR SCREEN
MRSA, PCR: NEGATIVE
Staphylococcus aureus: NEGATIVE

## 2020-11-23 NOTE — Progress Notes (Signed)
PCP - Dr. Mellody Dance Cardiologist - Dr. Montel Clock  PPM/ICD - n/a Device Orders -  Rep Notified -   Chest x-ray - 11/23/20 EKG - 11/23/20 Stress Test - patient and son stated yes, requested records from cardiologist ECHO - patient and son stated yes, requested records from cardiologist Cardiac Cath - patient denies  Sleep Study - patient denies CPAP - n/a  Fasting Blood Sugar -  n/a Checks Blood Sugar _____ times a day  Blood Thinner Instructions: last dose of Plavix was 11/19/20 Aspirin Instructions:last dose of ASA was 11/19/20. Instructed patient to hold celebrex as well   ERAS Protcol - n/a PRE-SURGERY Ensure or G2-   COVID TEST- after PAT appointment 11/23/20   Anesthesia review: yes, requested cardiac records  Patient denies shortness of breath, fever, cough and chest pain at PAT appointment   All instructions explained to the patient, with a verbal understanding of the material. Patient agrees to go over the instructions while at home for a better understanding. Patient also instructed to self quarantine after being tested for COVID-19. The opportunity to ask questions was provided.

## 2020-11-24 ENCOUNTER — Encounter (HOSPITAL_COMMUNITY): Payer: Self-pay

## 2020-11-24 LAB — SARS CORONAVIRUS 2 (TAT 6-24 HRS): SARS Coronavirus 2: POSITIVE — AB

## 2020-11-24 NOTE — Anesthesia Preprocedure Evaluation (Addendum)
Anesthesia Evaluation  Patient identified by MRN, date of birth, ID band Patient awake    Reviewed: Allergy & Precautions, NPO status , Patient's Chart, lab work & pertinent test results  Airway Mallampati: II  TM Distance: >3 FB Neck ROM: Full    Dental  (+) Upper Dentures, Lower Dentures   Pulmonary asthma , former smoker,    Pulmonary exam normal        Cardiovascular hypertension, Pt. on medications and Pt. on home beta blockers  Rhythm:Regular Rate:Normal     Neuro/Psych Anxiety CVA (2019), No Residual Symptoms    GI/Hepatic Neg liver ROS, GERD  Medicated,diverticulosis   Endo/Other  negative endocrine ROS  Renal/GU negative Renal ROS  negative genitourinary   Musculoskeletal  (+) Arthritis , Osteoarthritis,    Abdominal (+)  Abdomen: soft. Bowel sounds: normal.  Peds  Hematology negative hematology ROS (+)   Anesthesia Other Findings   Reproductive/Obstetrics                          Anesthesia Physical Anesthesia Plan  ASA: III  Anesthesia Plan: General   Post-op Pain Management:    Induction: Intravenous  PONV Risk Score and Plan: 3 and Ondansetron and Treatment may vary due to age or medical condition  Airway Management Planned: Mask and Oral ETT  Additional Equipment: None  Intra-op Plan:   Post-operative Plan: Extubation in OR  Informed Consent: I have reviewed the patients History and Physical, chart, labs and discussed the procedure including the risks, benefits and alternatives for the proposed anesthesia with the patient or authorized representative who has indicated his/her understanding and acceptance.     Dental advisory given  Plan Discussed with: CRNA  Anesthesia Plan Comments: (ECHO 02/22: [facility]): mild concentric LVH, LVEF => 70%, no regional wall motion abnormalities, grade I DD, mild TR, RVSP 45 mmHg, no pericardial effusion,  pleural effusion present  Lab Results      Component                Value               Date                      WBC                      5.7                 11/23/2020                HGB                      12.7                11/23/2020                HCT                      38.2                11/23/2020                MCV                      94.8                11/23/2020  PLT                      320                 11/23/2020           Lab Results      Component                Value               Date                      NA                       139                 11/23/2020                K                        4.6                 11/23/2020                CO2                      23                  11/23/2020                GLUCOSE                  86                  11/23/2020                BUN                      31 (H)              11/23/2020                CREATININE               1.17 (H)            11/23/2020                CALCIUM                  9.2                 11/23/2020                GFRNONAA                 44 (L)              11/23/2020                GFRAA                    90 (L)              08/24/2014          )      Anesthesia Quick Evaluation

## 2020-11-24 NOTE — Progress Notes (Signed)
Anesthesia Chart Review:  Case: [id number] Date/Time: 11/27/20 1425   Procedure: Laminectomy for facet/synovial cyst - T12-L1 (N/A Back) - 3C   Anesthesia type: General   Pre-op diagnosis: Synovial cyst   [facility]OM [facility] / [facility]   Surgeons: Tia Alert, MD      DISCUSSION: Patient is a 87 year old female scheduled for the above procedure.   History includes former smoker, HTN, asthma, GERD, chronic back pain, murmur (mild TR 11/08/20 echo), hard of hearing, CVA (right MCA CVA  ~09/2017), back surgery (left L3-4 hemilaminectomy 01/07/13, left L3-4 microdiscectomy 06/02/14 & 08/24/14), cervical fusion.   Last cardiology visit with Dr. Montel Clock was on 10/25/20 for routine six month follow-up.  Seen for history of HTN, hypercholesterolemia, CVA, and wandering atrial pacemaker.  Patient with some lightheadedness and DOE but did not report any new CV symptoms. He notes she had upcoming neurosurgery appointment for severe back pain after she did not receive relief from epidural injections.  EKG there showed "wandering atrial pacemaker heart rate 86 bpm.  Left axis deviation.  Anteroseptal Q waves.  No interval change."  He also notes she was asymptomatic of wandering atrial pacemaker but felt at high risk for developing atrial fibrillation in the future.  He ordered an echocardiogram which showed LVEF with no regional wall motion abnormality and no significant valvular disease.  Pleural effusion was noted but otherwise no specific details mentioned in the report. (11/23/2020 chest x-ray report by radiologist is still pending, but did not appear to show significant pleural effusion.) Dr. Montel Clock did sign a note of cardiac clearance for surgery classifying patient as "Moderate Risk" with permission to hold Plavix x 7 days for surgery.   Patient reported last Plavix and ASAS 11/19/20.     11/23/20 presurgical COVID-19 test positive. Patient apparently was coughing some during PAT RN visit but also with chronic  cough history and no other respiratory symptoms. Plan to postpone surgery until ~ March 2022. Follow-up final CXR report once available.      VS: BP (!) 148/60   Pulse 92   Temp 36.6 C   Resp 18   Ht 5\' 1"  (1.549 m)   Wt 59.3 kg   SpO2 98%   BMI 24.71 kg/m    PROVIDERS: , MD is PCP  Marylouise Stacks, MD is cardiologist [facility] in [place])   LABS: Labs reviewed: Acceptable for surgery. (all labs ordered are listed, but only abnormal results are displayed)  Labs Reviewed  SARS CORONAVIRUS 2 (TAT 6-24 HRS) - Abnormal; Notable for the following components:      Result Value   SARS Coronavirus 2 POSITIVE (*)    All other components within normal limits  BASIC METABOLIC PANEL - Abnormal; Notable for the following components:   BUN 31 (*)    Creatinine, Ser 1.17 (*)    GFR, Estimated 44 (*)    All other components within normal limits  SURGICAL PCR SCREEN  CBC WITH DIFFERENTIAL/PLATELET  PROTIME-INR     IMAGES: CXR 11/23/20: In process.  MRI L-spine 08/22/20 ([facility]): IMPRESSION:  1. Extensive multilevel lumbar spondylosis worsened at multiple levels from the 2014 comparison examination.  2. At T12-L1 there is type I degenerative endplate change, new trace anterolisthesis, moderate facet arthropathy with right-sided facet joint synovial cyst and disc bulge with central protrusion and broad-based right foraminal protrusion. Moderate central canal stenosis and moderate to severe right foraminal stenosis.  3. At  L3-L4 there is moderate facet arthropathy and broad-based left foraminal protrusion with moderate to severe left foraminal stenosis.  4. Small broad-based right paracentral/foraminal protrusion at L5-S1 with partial effacement of the right lateral recess and mild/moderate right foraminal stenosis. Additional multilevel lumbar spinal stenosis as discussed in detail above.  5. Multifactorial moderate right foraminal  stenosis at T11-T12.  6. Scoliotic curvature of the lumbar spine appears to have worsened from comparison although not optimally evaluated on this examination, consider frontal and lateral plain films.     EKG: 11/23/20 EKGs x2 reviewed. No significant change when compared to 05/30/14 & 01/07/13 tracings.  EKG 11/23/20 12:14:10  :  Sinus rhythm with Premature supraventricular complexes Left axis deviation Inferior infarct , age undetermined Anteroseptal infarct , age undetermined Abnormal ECG Confirmed by Kristeen Miss [phone]) on 11/23/2020 9:30:31 PM  EKG 11/23/20 12:11:48: Sinus rhythm with Premature atrial complexes Septal infarct , age undetermined Inferior infarct , age undetermined Abnormal ECG No significant change since last tracing Confirmed by Kristeen Miss [phone]) on 11/23/2020 9:30:28 PM  EKG 10/25/20 [facility]): Sinus rhythm with marked sinus arrhythmia with fusion complexes Left axis deviation Anterior septal infarct, age undetermined - Dr. Montel Clock reviewed, "wandering atrial pacemaker heart rate 86 bpm.  Left axis deviation. Anteroseptal Q waves.  No interval change."    CV: Echo 11/08/20 [facility]): Summary: There is mild concentric left ventricular hypertrophy. Left ventricular systolic function is normal.  LVEF > 70%. No regional wall motion abnormalities noted. Grade 1 diastolic dysfunction, abnormal relaxation pattern. The right ventricle is normal in size and function. There is mild tricuspid regurgitation.  Estimated right ventricular systolic pressure is 45 mmHg. There is a pleural effusion present.  There is no pericardial effusion.  According to 10/25/20 cardiology note by Dr. Montel Clock, 2019 work-up (at the time of CVA) included a Zio patch monitor that "did not show any evidence of atrial fibrillation" and an echo that showed "normal left atrial size and right atrial size. Intact interatrial septum with no evidence of PFO."    Past  Medical History:  Diagnosis Date  . Anxiety    takes Citalopram daily  . Arthritis   . Asthma    Symbicort inhaler prn  . Chronic back pain    HNP  . Cough   . Diverticulosis   . Family history of anesthesia complication    sister wakes up extremely sick  . GERD (gastroesophageal reflux disease)    doesn't take any meds;watching what she eats  . Heart murmur    echo 11/08/20 [facility] [place]): mild concentric LVH, LVEF => 70%, no regional wall motion abnormalities, grade I DD, mild TR, RVSP 45 mmHg, no pericardial effusion, pleural effusion present  . Hemorrhoids   . History of blood transfusion    no abnormal reaction noted  . History of colon polyps   . HOH (hard of hearing)   . Hypertension    takes Benicar and Metoprolol daily  . Stroke (HCC) 10/2017   per patient "mild one", no deficits  . Urinary frequency     Past Surgical History:  Procedure Laterality Date  . BACK SURGERY    . CERVICAL FUSION    . COLONOSCOPY    . COLONOSCOPY    . EYE SURGERY     bil cataract  . JOINT REPLACEMENT     right hip  . LUMBAR LAMINECTOMY/DECOMPRESSION MICRODISCECTOMY Left 01/07/2013   Procedure: LEFT LUMBAR THREE TO FOUR LAMINECTOMY/DECOMPRESSION MICRODISCECTOMY;  Surgeon: Kermit Balo  Yetta Barre, MD;  Location[facility];  Service: Neurosurgery;  Laterality: Left;  LEFT LUMBAR THREE TO FOUR LAMINECTOMY/DECOMPRESSION MICRODISCECTOMY  . LUMBAR LAMINECTOMY/DECOMPRESSION MICRODISCECTOMY Left 06/02/2014   Procedure: Left Lumbar Three to Four Extraforaminal Microdiskectomy;  Surgeon: Tia Alert, MD;  [facility];  Service: Neurosurgery;  Laterality: Left;  . LUMBAR LAMINECTOMY/DECOMPRESSION MICRODISCECTOMY Left 08/24/2014   Procedure: Re-do extraforaminal Microdiscectomy  - Lumbar three to lumbar four left;  Surgeon: Tia Alert, MD;  [facility];  Service: Neurosurgery;  Laterality: Left;    MEDICATIONS: . acetaminophen-codeine (TYLENOL #3) 300-30 MG tablet  .  amLODipine (NORVASC) 2.5 MG tablet  . aspirin EC 81 MG tablet  . atorvastatin (LIPITOR) 20 MG tablet  . budesonide-formoterol (SYMBICORT) 160-4.5 MCG/ACT inhaler  . celecoxib (CELEBREX) 200 MG capsule  . Cholecalciferol (VITAMIN D3) 2000 UNITS capsule  . citalopram (CELEXA) 20 MG tablet  . clopidogrel (PLAVIX) 75 MG tablet  . esomeprazole (NEXIUM) 20 MG capsule  . Fiber POWD  . hydrochlorothiazide (MICROZIDE) 12.5 MG capsule  . IRON-VITAMIN C PO  . losartan (COZAAR) 100 MG tablet  . metoprolol succinate (TOPROL-XL) 100 MG 24 hr tablet  . Omega-3 Fatty Acids (FISH OIL) 1200 MG CAPS   No current facility-administered medications for this encounter.    Shonna Chock, PA-C Surgical Short Stay/Anesthesiology [facility] Phone [phone] [facility] Phone [phone] 11/24/2020 12:49 PM

## 2020-11-24 NOTE — Progress Notes (Signed)
LVM for Erie Noe at Dr. Yetta Barre' office notifying them that the patient tested positive for COVID.  Informed them that the patient was coughing during PAT appointment but has a history of a chronic cough.  Patient did not have any other symptoms.  They will need to notify the patient that her surgery will be postponed.

## 2020-11-25 ENCOUNTER — Telehealth: Payer: Self-pay | Admitting: Adult Health

## 2020-11-25 NOTE — Telephone Encounter (Signed)
Called to discuss with patient about COVID-19 symptoms and the use of one of the available treatments for those with mild to moderate Covid symptoms and at a high risk of hospitalization.  Pt appears to qualify for outpatient treatment due to co-morbid conditions and/or a member of an at-risk group in accordance with the FDA Emergency Use Authorization.    Unable to reach pt - called with no answer , unable to leave vm    Rubye Oaks NP

## 2020-12-15 ENCOUNTER — Other Ambulatory Visit: Payer: Self-pay

## 2020-12-15 ENCOUNTER — Encounter (HOSPITAL_COMMUNITY): Payer: Self-pay | Admitting: Neurological Surgery

## 2020-12-15 NOTE — Progress Notes (Addendum)
Robin. Robin Patterson tested positive for Covids9 on 11/23/20, and does not need to be retested at this time.  I spoke with Robin Patterson, patient's son- his number is the preferred number, I asked if patient was available patient lives in a different home. Mr. Robin Patterson stated that he talks to all   medical people for his mother, because she is hard of hearing.  I asked Robin Patterson to  Have Admitting office or nurse to speak with Robin Patterson and have patient sign HIPPA release, stating that we may speak with her children and put a FYI in Epic with that information.  Robin Patterson stated that patient only had cold/sinuses symptoms with she was positive for Covid.  Robin Patterson only gets short of breath due to back pain.

## 2020-12-18 ENCOUNTER — Encounter (HOSPITAL_COMMUNITY): Payer: Self-pay | Admitting: Neurological Surgery

## 2020-12-18 ENCOUNTER — Other Ambulatory Visit: Payer: Self-pay

## 2020-12-18 ENCOUNTER — Ambulatory Visit (HOSPITAL_COMMUNITY): Payer: Medicare Other | Admitting: Certified Registered Nurse Anesthetist

## 2020-12-18 ENCOUNTER — Ambulatory Visit (HOSPITAL_COMMUNITY)
Admission: RE | Admit: 2020-12-18 | Discharge: 2020-12-18 | Disposition: A | Discharge status: Home / Self Care | Payer: Medicare Other | Attending: Neurological Surgery | Admitting: Neurological Surgery

## 2020-12-18 ENCOUNTER — Ambulatory Visit (HOSPITAL_COMMUNITY): Payer: Medicare Other | Admitting: Vascular Surgery

## 2020-12-18 ENCOUNTER — Ambulatory Visit (HOSPITAL_COMMUNITY)
Admission: RE | Disposition: A | Discharge status: Home / Self Care | Payer: Self-pay | Source: Home / Self Care | Attending: Neurological Surgery

## 2020-12-18 ENCOUNTER — Ambulatory Visit (HOSPITAL_COMMUNITY): Payer: Medicare Other

## 2020-12-18 DIAGNOSIS — Z96641 Presence of right artificial hip joint: Secondary | ICD-10-CM | POA: Insufficient documentation

## 2020-12-18 DIAGNOSIS — Z791 Long term (current) use of non-steroidal anti-inflammatories (NSAID): Secondary | ICD-10-CM | POA: Diagnosis not present

## 2020-12-18 DIAGNOSIS — M7138 Other bursal cyst, other site: Secondary | ICD-10-CM | POA: Insufficient documentation

## 2020-12-18 DIAGNOSIS — I10 Essential (primary) hypertension: Secondary | ICD-10-CM | POA: Diagnosis not present

## 2020-12-18 DIAGNOSIS — Z7982 Long term (current) use of aspirin: Secondary | ICD-10-CM | POA: Insufficient documentation

## 2020-12-18 DIAGNOSIS — Z419 Encounter for procedure for purposes other than remedying health state, unspecified: Secondary | ICD-10-CM

## 2020-12-18 DIAGNOSIS — Z7951 Long term (current) use of inhaled steroids: Secondary | ICD-10-CM | POA: Insufficient documentation

## 2020-12-18 DIAGNOSIS — Z9889 Other specified postprocedural states: Secondary | ICD-10-CM | POA: Diagnosis present

## 2020-12-18 DIAGNOSIS — Z87891 Personal history of nicotine dependence: Secondary | ICD-10-CM | POA: Diagnosis not present

## 2020-12-18 DIAGNOSIS — Z981 Arthrodesis status: Secondary | ICD-10-CM | POA: Diagnosis not present

## 2020-12-18 DIAGNOSIS — Z8673 Personal history of transient ischemic attack (TIA), and cerebral infarction without residual deficits: Secondary | ICD-10-CM | POA: Insufficient documentation

## 2020-12-18 DIAGNOSIS — M5416 Radiculopathy, lumbar region: Secondary | ICD-10-CM | POA: Diagnosis not present

## 2020-12-18 HISTORY — DX: Dyspnea, unspecified: R06.00

## 2020-12-18 HISTORY — PX: LUMBAR LAMINECTOMY/DECOMPRESSION MICRODISCECTOMY: SHX5026

## 2020-12-18 LAB — CBC
HCT: 37.6 % (ref 36.0–46.0)
Hemoglobin: 12.5 g/dL (ref 12.0–15.0)
MCH: 31.3 pg (ref 26.0–34.0)
MCHC: 33.2 g/dL (ref 30.0–36.0)
MCV: 94 fL (ref 80.0–100.0)
Platelets: 312 10*3/uL (ref 150–400)
RBC: 4 MIL/uL (ref 3.87–5.11)
RDW: 13.4 % (ref 11.5–15.5)
WBC: 7.6 10*3/uL (ref 4.0–10.5)
nRBC: 0 % (ref 0.0–0.2)

## 2020-12-18 LAB — BASIC METABOLIC PANEL
Anion gap: 9 (ref 5–15)
BUN: 23 mg/dL (ref 8–23)
CO2: 24 mmol/L (ref 22–32)
Calcium: 9.6 mg/dL (ref 8.9–10.3)
Chloride: 104 mmol/L (ref 98–111)
Creatinine, Ser: 0.99 mg/dL (ref 0.44–1.00)
GFR, Estimated: 53 mL/min — ABNORMAL LOW (ref >60)
Glucose, Bld: 96 mg/dL (ref 70–99)
Potassium: 3.7 mmol/L (ref 3.5–5.1)
Sodium: 137 mmol/L (ref 135–145)

## 2020-12-18 SURGERY — LUMBAR LAMINECTOMY/DECOMPRESSION MICRODISCECTOMY 1 LEVEL
Anesthesia: General | Site: Back

## 2020-12-18 MED ORDER — CELECOXIB 200 MG PO CAPS: 200.0000 mg | ORAL_CAPSULE | Freq: Two times a day (BID) | ORAL | Status: DC

## 2020-12-18 MED ORDER — CITALOPRAM HYDROBROMIDE 20 MG PO TABS
20.0000 mg | ORAL_TABLET | Freq: Every day | ORAL | Status: DC
Start: 2020-12-19 — End: 2020-12-19

## 2020-12-18 MED ORDER — FENTANYL CITRATE (PF) 100 MCG/2ML IJ SOLN: 25.0000 ug | INTRAMUSCULAR | Status: DC | PRN

## 2020-12-18 MED ORDER — LACTATED RINGERS IV SOLN: INTRAVENOUS | Status: DC

## 2020-12-18 MED ORDER — SUGAMMADEX SODIUM 200 MG/2ML IV SOLN
INTRAVENOUS | Status: DC | PRN
  Administered 2020-12-18: 200 mg via INTRAVENOUS

## 2020-12-18 MED ORDER — LIDOCAINE 2% (20 MG/ML) 5 ML SYRINGE
INTRAMUSCULAR | Status: DC | PRN
  Administered 2020-12-18: 60 mg via INTRAVENOUS

## 2020-12-18 MED ORDER — DEXAMETHASONE SODIUM PHOSPHATE 10 MG/ML IJ SOLN
10.0000 mg | Freq: Once | INTRAMUSCULAR | Status: AC
  Administered 2020-12-18: 10 mg via INTRAVENOUS
  Filled 2020-12-18: qty 1

## 2020-12-18 MED ORDER — CEFAZOLIN SODIUM-DEXTROSE 2-4 GM/100ML-% IV SOLN
2.0000 g | INTRAVENOUS | Status: AC
  Administered 2020-12-18: 2 g via INTRAVENOUS
  Filled 2020-12-18: qty 100

## 2020-12-18 MED ORDER — ONDANSETRON HCL 4 MG/2ML IJ SOLN: 4.0000 mg | Freq: Four times a day (QID) | INTRAMUSCULAR | Status: DC | PRN

## 2020-12-18 MED ORDER — FENTANYL CITRATE (PF) 250 MCG/5ML IJ SOLN
INTRAMUSCULAR | Status: DC | PRN
  Administered 2020-12-18: 75 ug via INTRAVENOUS

## 2020-12-18 MED ORDER — ACETAMINOPHEN 500 MG PO TABS
1000.0000 mg | ORAL_TABLET | ORAL | Status: AC
  Administered 2020-12-18: 500 mg via ORAL
  Filled 2020-12-18: qty 2

## 2020-12-18 MED ORDER — THROMBIN 5000 UNITS EX KIT
PACK | CUTANEOUS | Status: AC
  Filled 2020-12-18: qty 3

## 2020-12-18 MED ORDER — SODIUM CHLORIDE 0.9% FLUSH: 3.0000 mL | Freq: Two times a day (BID) | INTRAVENOUS | Status: DC

## 2020-12-18 MED ORDER — ONDANSETRON HCL 4 MG/2ML IJ SOLN
INTRAMUSCULAR | Status: DC | PRN
  Administered 2020-12-18: 4 mg via INTRAVENOUS

## 2020-12-18 MED ORDER — CELECOXIB 200 MG PO CAPS: 200.0000 mg | ORAL_CAPSULE | Freq: Every day | ORAL | Status: DC

## 2020-12-18 MED ORDER — HYDROCODONE-ACETAMINOPHEN 7.5-325 MG PO TABS
1.0000 | ORAL_TABLET | Freq: Four times a day (QID) | ORAL | Status: DC
Start: 2020-12-18 — End: 2020-12-19

## 2020-12-18 MED ORDER — PHENOL 1.4 % MT LIQD: 1.0000 | OROMUCOSAL | Status: DC | PRN

## 2020-12-18 MED ORDER — ROCURONIUM BROMIDE 10 MG/ML (PF) SYRINGE
PREFILLED_SYRINGE | INTRAVENOUS | Status: DC | PRN
  Administered 2020-12-18: 50 mg via INTRAVENOUS

## 2020-12-18 MED ORDER — PROPOFOL 10 MG/ML IV BOLUS
INTRAVENOUS | Status: DC | PRN
  Administered 2020-12-18: 80 mg via INTRAVENOUS

## 2020-12-18 MED ORDER — DEXAMETHASONE SODIUM PHOSPHATE 10 MG/ML IJ SOLN
INTRAMUSCULAR | Status: AC
  Filled 2020-12-18: qty 1

## 2020-12-18 MED ORDER — PHENYLEPHRINE HCL-NACL 10-0.9 MG/250ML-% IV SOLN
INTRAVENOUS | Status: DC | PRN
  Administered 2020-12-18: 25 ug/min via INTRAVENOUS

## 2020-12-18 MED ORDER — SODIUM CHLORIDE 0.9% FLUSH: 3.0000 mL | INTRAVENOUS | Status: DC | PRN

## 2020-12-18 MED ORDER — ACETAMINOPHEN 650 MG RE SUPP: 650.0000 mg | RECTAL | Status: DC | PRN

## 2020-12-18 MED ORDER — PROPOFOL 10 MG/ML IV BOLUS
INTRAVENOUS | Status: AC
  Filled 2020-12-18: qty 20

## 2020-12-18 MED ORDER — ROCURONIUM BROMIDE 10 MG/ML (PF) SYRINGE
PREFILLED_SYRINGE | INTRAVENOUS | Status: AC
  Filled 2020-12-18: qty 10

## 2020-12-18 MED ORDER — THROMBIN 5000 UNITS EX SOLR
OROMUCOSAL | Status: DC | PRN
  Administered 2020-12-18: 5 mL via TOPICAL

## 2020-12-18 MED ORDER — ACETAMINOPHEN 325 MG PO TABS: 650.0000 mg | ORAL_TABLET | ORAL | Status: DC | PRN

## 2020-12-18 MED ORDER — POTASSIUM CHLORIDE IN NACL 20-0.9 MEQ/L-% IV SOLN: INTRAVENOUS | Status: DC

## 2020-12-18 MED ORDER — METHOCARBAMOL 500 MG PO TABS: 500.0000 mg | ORAL_TABLET | Freq: Four times a day (QID) | ORAL | 0 refills | Status: AC

## 2020-12-18 MED ORDER — MOMETASONE FURO-FORMOTEROL FUM 200-5 MCG/ACT IN AERO
2.0000 | INHALATION_SPRAY | Freq: Two times a day (BID) | RESPIRATORY_TRACT | Status: DC
  Filled 2020-12-18: qty 8.8

## 2020-12-18 MED ORDER — PHENYLEPHRINE 40 MCG/ML (10ML) SYRINGE FOR IV PUSH (FOR BLOOD PRESSURE SUPPORT)
PREFILLED_SYRINGE | INTRAVENOUS | Status: AC
  Filled 2020-12-18: qty 10

## 2020-12-18 MED ORDER — CHLORHEXIDINE GLUCONATE CLOTH 2 % EX PADS: 6.0000 | MEDICATED_PAD | Freq: Once | CUTANEOUS | Status: DC

## 2020-12-18 MED ORDER — 0.9 % SODIUM CHLORIDE (POUR BTL) OPTIME
TOPICAL | Status: DC | PRN
  Administered 2020-12-18: 1000 mL

## 2020-12-18 MED ORDER — METOPROLOL SUCCINATE ER 100 MG PO TB24: 100.0000 mg | ORAL_TABLET | Freq: Every day | ORAL | Status: DC

## 2020-12-18 MED ORDER — ORAL CARE MOUTH RINSE: 15.0000 mL | Freq: Once | OROMUCOSAL | Status: AC

## 2020-12-18 MED ORDER — CEFAZOLIN SODIUM-DEXTROSE 2-4 GM/100ML-% IV SOLN: 2.0000 g | Freq: Three times a day (TID) | INTRAVENOUS | Status: DC

## 2020-12-18 MED ORDER — HEMOSTATIC AGENTS (NO CHARGE) OPTIME
TOPICAL | Status: DC | PRN
  Administered 2020-12-18: 1 via TOPICAL

## 2020-12-18 MED ORDER — DEXAMETHASONE SODIUM PHOSPHATE 4 MG/ML IJ SOLN: 4.0000 mg | Freq: Four times a day (QID) | INTRAMUSCULAR | Status: DC

## 2020-12-18 MED ORDER — THROMBIN 5000 UNITS EX SOLR
CUTANEOUS | Status: DC | PRN
  Administered 2020-12-18 (×2): 5000 [IU] via TOPICAL

## 2020-12-18 MED ORDER — AMLODIPINE BESYLATE 5 MG PO TABS: 2.5000 mg | ORAL_TABLET | Freq: Every day | ORAL | Status: DC

## 2020-12-18 MED ORDER — SODIUM CHLORIDE 0.9 % IV SOLN
250.0000 mL | INTRAVENOUS | Status: DC
  Administered 2020-12-18: 250 mL via INTRAVENOUS

## 2020-12-18 MED ORDER — HYDROCHLOROTHIAZIDE 12.5 MG PO CAPS
12.5000 mg | ORAL_CAPSULE | Freq: Every day | ORAL | Status: DC
Start: 2020-12-18 — End: 2020-12-19

## 2020-12-18 MED ORDER — GABAPENTIN 300 MG PO CAPS
300.0000 mg | ORAL_CAPSULE | ORAL | Status: AC
  Administered 2020-12-18: 300 mg via ORAL
  Filled 2020-12-18: qty 1

## 2020-12-18 MED ORDER — LOSARTAN POTASSIUM 50 MG PO TABS: 100.0000 mg | ORAL_TABLET | Freq: Every day | ORAL | Status: DC

## 2020-12-18 MED ORDER — MORPHINE SULFATE (PF) 2 MG/ML IV SOLN
1.0000 mg | INTRAVENOUS | Status: DC | PRN
Start: 2020-12-18 — End: 2020-12-19

## 2020-12-18 MED ORDER — CHLORHEXIDINE GLUCONATE 0.12 % MT SOLN
15.0000 mL | Freq: Once | OROMUCOSAL | Status: AC
  Administered 2020-12-18: 15 mL via OROMUCOSAL
  Filled 2020-12-18: qty 15

## 2020-12-18 MED ORDER — SENNA 8.6 MG PO TABS: 1.0000 | ORAL_TABLET | Freq: Two times a day (BID) | ORAL | Status: DC

## 2020-12-18 MED ORDER — ONDANSETRON HCL 4 MG/2ML IJ SOLN
INTRAMUSCULAR | Status: AC
  Filled 2020-12-18: qty 2

## 2020-12-18 MED ORDER — ONDANSETRON HCL 4 MG PO TABS: 4.0000 mg | ORAL_TABLET | Freq: Four times a day (QID) | ORAL | Status: DC | PRN

## 2020-12-18 MED ORDER — HYDROCODONE-ACETAMINOPHEN 5-325 MG PO TABS: 1.0000 | ORAL_TABLET | ORAL | 0 refills | Status: AC | PRN

## 2020-12-18 MED ORDER — BUPIVACAINE HCL (PF) 0.25 % IJ SOLN
INTRAMUSCULAR | Status: DC | PRN
  Administered 2020-12-18: 4 mL

## 2020-12-18 MED ORDER — LIDOCAINE 2% (20 MG/ML) 5 ML SYRINGE
INTRAMUSCULAR | Status: AC
  Filled 2020-12-18: qty 5

## 2020-12-18 MED ORDER — BUPIVACAINE HCL (PF) 0.25 % IJ SOLN
INTRAMUSCULAR | Status: AC
  Filled 2020-12-18: qty 30

## 2020-12-18 MED ORDER — MENTHOL 3 MG MT LOZG: 1.0000 | LOZENGE | OROMUCOSAL | Status: DC | PRN

## 2020-12-18 MED ORDER — DEXAMETHASONE 4 MG PO TABS: 4.0000 mg | ORAL_TABLET | Freq: Four times a day (QID) | ORAL | Status: DC

## 2020-12-18 MED ORDER — FENTANYL CITRATE (PF) 250 MCG/5ML IJ SOLN
INTRAMUSCULAR | Status: AC
  Filled 2020-12-18: qty 5

## 2020-12-18 MED ORDER — ONDANSETRON HCL 4 MG/2ML IJ SOLN: 4.0000 mg | Freq: Once | INTRAMUSCULAR | Status: DC | PRN

## 2020-12-18 SURGICAL SUPPLY — 43 items
BAND RUBBER #18 3X1/16 STRL (MISCELLANEOUS) ×4 IMPLANT
BENZOIN TINCTURE PRP APPL 2/3 (GAUZE/BANDAGES/DRESSINGS) ×2 IMPLANT
BUR CARBIDE MATCH 3.0 (BURR) ×2 IMPLANT
CANISTER SUCT 3000ML PPV (MISCELLANEOUS) ×2 IMPLANT
CLSR STERI-STRIP ANTIMIC 1/2X4 (GAUZE/BANDAGES/DRESSINGS) ×2 IMPLANT
COVER WAND RF STERILE (DRAPES) ×2 IMPLANT
DERMABOND ADVANCED (GAUZE/BANDAGES/DRESSINGS) ×1
DERMABOND ADVANCED .7 DNX12 (GAUZE/BANDAGES/DRESSINGS) ×1 IMPLANT
DIFFUSER DRILL AIR PNEUMATIC (MISCELLANEOUS) IMPLANT
DRAPE LAPAROTOMY 100X72X124 (DRAPES) ×2 IMPLANT
DRAPE MICROSCOPE LEICA (MISCELLANEOUS) ×2 IMPLANT
DRAPE SURG 17X23 STRL (DRAPES) ×2 IMPLANT
DRSG OPSITE POSTOP 4X6 (GAUZE/BANDAGES/DRESSINGS) ×2 IMPLANT
DURAPREP 26ML APPLICATOR (WOUND CARE) ×2 IMPLANT
ELECT REM PT RETURN 9FT ADLT (ELECTROSURGICAL) ×2
ELECTRODE REM PT RTRN 9FT ADLT (ELECTROSURGICAL) ×1 IMPLANT
GAUZE 4X4 16PLY RFD (DISPOSABLE) IMPLANT
GLOVE BIO SURGEON STRL SZ7 (GLOVE) ×2 IMPLANT
GLOVE BIO SURGEON STRL SZ8 (GLOVE) ×2 IMPLANT
GLOVE SRG 8 PF TXTR STRL LF DI (GLOVE) ×4 IMPLANT
GLOVE SURG UNDER POLY LF SZ7 (GLOVE) ×4 IMPLANT
GLOVE SURG UNDER POLY LF SZ8 (GLOVE) ×4
GOWN STRL REUS W/ TWL LRG LVL3 (GOWN DISPOSABLE) ×1 IMPLANT
GOWN STRL REUS W/ TWL XL LVL3 (GOWN DISPOSABLE) ×1 IMPLANT
GOWN STRL REUS W/TWL 2XL LVL3 (GOWN DISPOSABLE) ×2 IMPLANT
GOWN STRL REUS W/TWL LRG LVL3 (GOWN DISPOSABLE) ×1
GOWN STRL REUS W/TWL XL LVL3 (GOWN DISPOSABLE) ×1
KIT BASIN OR (CUSTOM PROCEDURE TRAY) ×2 IMPLANT
KIT TURNOVER KIT B (KITS) ×2 IMPLANT
NEEDLE HYPO 25X1 1.5 SAFETY (NEEDLE) ×2 IMPLANT
NEEDLE SPNL 20GX3.5 QUINCKE YW (NEEDLE) IMPLANT
NS IRRIG 1000ML POUR BTL (IV SOLUTION) ×2 IMPLANT
PACK LAMINECTOMY NEURO (CUSTOM PROCEDURE TRAY) ×2 IMPLANT
PAD ARMBOARD 7.5X6 YLW CONV (MISCELLANEOUS) ×6 IMPLANT
SPONGE SURGIFOAM ABS GEL SZ50 (HEMOSTASIS) ×2 IMPLANT
STRIP CLOSURE SKIN 1/2X4 (GAUZE/BANDAGES/DRESSINGS) ×2 IMPLANT
SUT VIC AB 0 CT1 18XCR BRD8 (SUTURE) ×1 IMPLANT
SUT VIC AB 0 CT1 8-18 (SUTURE) ×1
SUT VIC AB 2-0 CP2 18 (SUTURE) ×2 IMPLANT
SUT VIC AB 3-0 SH 8-18 (SUTURE) ×2 IMPLANT
TOWEL GREEN STERILE (TOWEL DISPOSABLE) ×2 IMPLANT
TOWEL GREEN STERILE FF (TOWEL DISPOSABLE) ×2 IMPLANT
WATER STERILE IRR 1000ML POUR (IV SOLUTION) ×2 IMPLANT

## 2020-12-18 NOTE — Discharge Instructions (Signed)
Wound Care Keep incision covered and dry for three days.   Do not put any creams, lotions, or ointments on incision. Leave steri-strips on back.  They will fall off by themselves. Activity Walk each and every day, increasing distance each day. No lifting greater than 5 lbs.  Avoid excessive neck motion. No driving for 2 weeks; may ride as a passenger locally.  Diet Resume your normal diet.  Return to Work Will be discussed at you follow up appointment. Call Your Doctor If Any of These Occur Redness, drainage, or swelling at the wound.  Temperature greater than 101 degrees. Severe pain not relieved by pain medication. Incision starts to come apart. Follow Up Appt Call  [phone])  for problems.  If you have any hardware placed in your spine, you will need an x-ray before your appointment.  

## 2020-12-18 NOTE — Transfer of Care (Signed)
Immediate Anesthesia Transfer of Care Note  Patient: Robin Patterson  Procedure(s) Performed: Laminectomy for facet/synovial cyst - Thoracic twelve-Lumbar one (N/A Back)  Patient Location: PACU  Anesthesia Type:General  Level of Consciousness: drowsy  Airway & Oxygen Therapy: Patient Spontanous Breathing and Patient connected to face mask oxygen  Post-op Assessment: Report given to RN, Post -op Vital signs reviewed and stable and Patient moving all extremities X 4  Post vital signs: Reviewed and stable  Last Vitals:  Vitals Value Taken Time  BP 168/81 12/18/20 1303  Temp    Pulse 75 12/18/20 1306  Resp 12 12/18/20 1306  SpO2 100 % 12/18/20 1306  Vitals shown include unvalidated device data.  Last Pain:  Vitals:   12/18/20 0950  TempSrc:   PainSc: 0-No pain         Complications: No complications documented.

## 2020-12-18 NOTE — Anesthesia Procedure Notes (Signed)
Procedure Name: Intubation Date/Time: 12/18/2020 11:29 AM Performed by: Reece Agar, CRNA Pre-anesthesia Checklist: Patient identified, Emergency Drugs available, Suction available and Patient being monitored Patient Re-evaluated:Patient Re-evaluated prior to induction Oxygen Delivery Method: Circle System Utilized Preoxygenation: Pre-oxygenation with 100% oxygen Induction Type: IV induction Ventilation: Mask ventilation without difficulty Laryngoscope Size: Mac and 3 Grade View: Grade I Tube type: Oral Tube size: 7.0 mm Number of attempts: 1 Airway Equipment and Method: Stylet Placement Confirmation: ETT inserted through vocal cords under direct vision,  positive ETCO2 and breath sounds checked- equal and bilateral Secured at: 21 cm Tube secured with: Tape Dental Injury: Teeth and Oropharynx as per pre-operative assessment

## 2020-12-18 NOTE — Op Note (Signed)
12/18/2020  12:55 PM  PATIENT:  Robin Patterson  87 y.o. female  PRE-OPERATIVE DIAGNOSIS: Right T12-L1 synovial cyst with back and right hip pain  POST-OPERATIVE DIAGNOSIS:  same  PROCEDURE: Right T12-L1 hemilaminectomy medial facetectomy foraminotomies for resection of synovial cyst  SURGEON:  Marikay Alar, MD  ASSISTANTS: Verlin Dike, FNP  ANESTHESIA:   General  EBL: 20 ml  Total I/O In: 100 [IV Piggyback:100] Out: 20 [Blood:20]  BLOOD ADMINISTERED: none  DRAINS: None  SPECIMEN:  none  INDICATION FOR PROCEDURE: This patient presented with back and right hip pain. Imaging showed right T12-L1 synovial cyst. The patient tried conservative measures without relief. Pain was debilitating. Recommended right T12-L1 laminectomy for evacuation of synovial cyst. Patient understood the risks, benefits, and alternatives and potential outcomes and wished to proceed.  PROCEDURE DETAILS: The patient was taken to the operating room and after induction of adequate generalized endotracheal anesthesia, the patient was rolled into the prone position on the Wilson frame and all pressure points were padded. The thoracic and lumbar region was cleaned and then prepped with DuraPrep and draped in the usual sterile fashion. 5 cc of local anesthesia was injected and then a dorsal midline incision was made and carried down to the thoracolumbar fascia. The fascia was opened and the paraspinous musculature was taken down in a subperiosteal fashion to expose T12-L1 on the right. Intraoperative x-ray confirmed my level, and then I used a combination of the high-speed drill and the Kerrison punches to perform a hemilaminectomy, medial facetectomy, and foraminotomy at T12-L1 on the right. The underlying yellow ligament was opened and removed in a piecemeal fashion to expose the underlying dura and exiting nerve root.  Very careful technique was used to create no downward pressure on the dura or spinal cord.  I used  a nerve hook to work between the dura and the synovial cyst.  I completely remove the synovial cyst distally until I was just distal to the pedicle.  I undercut the lateral recess and dissected down until I was medial to and distal to the pedicle. The nerve root was well decompressed. . I then palpated with a coronary dilator along the nerve root and into the foramen to assure adequate decompression. I felt no more compression of the nerve root. I irrigated with saline solution containing bacitracin. Achieved hemostasis with bipolar cautery, lined the dura with Gelfoam, and then closed the fascia with 0 Vicryl. I closed the subcutaneous tissues with 2-0 Vicryl and the subcuticular tissues with 3-0 Vicryl. The skin was then closed with benzoin and Steri-Strips. The drapes were removed, a sterile dressing was applied.  My nurse practitioner was involved in the exposure, safe retraction of the neural elements, the disc work and the closure. the patient was awakened from general anesthesia and transferred to the recovery room in stable condition. At the end of the procedure all sponge, needle and instrument counts were correct.    PLAN OF CARE: Admit for overnight observation  PATIENT DISPOSITION:  PACU - hemodynamically stable.   Delay start of Pharmacological VTE agent (>24hrs) due to surgical blood loss or risk of bleeding:  yes

## 2020-12-18 NOTE — H&P (Signed)
Subjective: Patient is a 87 y.o. female admitted for back and right hip pain. Onset of symptoms was several months ago, gradually worsening since that time.  The pain is rated severe, unremitting, and is located at the right lumbar area and radiates to hip. The pain is described as aching and occurs all day. The symptoms have been progressive. Symptoms are exacerbated by exercise and standing. MRI or CT showed synovial cyst at T12-L1 on the right  Past Medical History:  Diagnosis Date  . Anxiety    takes Citalopram daily  . Arthritis   . Asthma    Symbicort inhaler prn  . Chronic back pain    HNP  . Cough   . Diverticulosis   . Dyspnea    due to pain  . Family history of anesthesia complication    sister wakes up extremely sick  . GERD (gastroesophageal reflux disease)    doesn't take any meds;watching what she eats  . Heart murmur    echo 11/08/20 [facility]): mild concentric LVH, LVEF => 70%, no regional wall motion abnormalities, grade I DD, mild TR, RVSP 45 mmHg, no pericardial effusion, pleural effusion present  . Hemorrhoids   . History of blood transfusion    no abnormal reaction noted  . History of colon polyps   . HOH (hard of hearing)   . Hypertension    takes Benicar and Metoprolol daily  . Stroke (HCC) 10/2017   per patient "mild one", no deficits  . Urinary frequency     Past Surgical History:  Procedure Laterality Date  . BACK SURGERY    . CERVICAL FUSION    . COLONOSCOPY    . COLONOSCOPY    . EYE SURGERY     bil cataract  . JOINT REPLACEMENT     right hip  . LUMBAR LAMINECTOMY/DECOMPRESSION MICRODISCECTOMY Left 01/07/2013   Procedure: LEFT LUMBAR THREE TO FOUR LAMINECTOMY/DECOMPRESSION MICRODISCECTOMY;  Surgeon: Tia Alert, MD;  Location[facility];  Service: Neurosurgery;  Laterality: Left;  LEFT LUMBAR THREE TO FOUR LAMINECTOMY/DECOMPRESSION MICRODISCECTOMY  . LUMBAR LAMINECTOMY/DECOMPRESSION MICRODISCECTOMY Left 06/02/2014   Procedure:  Left Lumbar Three to Four Extraforaminal Microdiskectomy;  Surgeon: Tia Alert, MD;  Location[facility];  Service: Neurosurgery;  Laterality: Left;  . LUMBAR LAMINECTOMY/DECOMPRESSION MICRODISCECTOMY Left 08/24/2014   Procedure: Re-do extraforaminal Microdiscectomy  - Lumbar three to lumbar four left;  Surgeon: Tia Alert, MD;  Location[facility];  Service: Neurosurgery;  Laterality: Left;    Prior to Admission medications   Medication Sig Start Date End Date Taking? Authorizing Provider  acetaminophen-codeine (TYLENOL #3) 300-30 MG tablet Take 0.5 tablets by mouth daily.   Yes [provider]  amLODipine (NORVASC) 2.5 MG tablet Take 2.5 mg by mouth daily.   Yes [provider]  aspirin EC 81 MG tablet Take 81 mg by mouth daily. Swallow whole.   Yes [provider]  atorvastatin (LIPITOR) 20 MG tablet Take 20 mg by mouth daily.   Yes [provider]  budesonide-formoterol (SYMBICORT) 160-4.5 MCG/ACT inhaler Inhale 2 puffs into the lungs daily as needed (for wheezing).   Yes [provider]  celecoxib (CELEBREX) 200 MG capsule Take 200 mg by mouth daily.   Yes [provider]  Cholecalciferol (VITAMIN D3) 2000 UNITS capsule Take 2,000 Units by mouth daily.   Yes [provider]  citalopram (CELEXA) 20 MG tablet Take 20 mg by mouth daily.   Yes [provider]  clopidogrel (PLAVIX) 75 MG tablet Take 75 mg by mouth daily.   Yes [provider]  esomeprazole (NEXIUM) 20 MG capsule Take 20 mg by mouth daily at 12 noon.   Yes [provider]  Fiber POWD Take 1 Scoop by mouth at bedtime.   Yes [provider]  hydrochlorothiazide (MICROZIDE) 12.5 MG capsule Take 12.5 mg by mouth daily.   Yes [provider]  IRON-VITAMIN C PO Take 1 tablet by mouth daily.   Yes [provider]  losartan (COZAAR) 100 MG tablet Take 100 mg by mouth daily.   Yes [provider]   metoprolol succinate (TOPROL-XL) 100 MG 24 hr tablet Take 100 mg by mouth daily. Take with or immediately following a meal.   Yes [provider]  Omega-3 Fatty Acids (FISH OIL) 1200 MG CAPS Take 1,200 mg by mouth daily.   Yes [provider]   No Known Allergies  Social History   Tobacco Use  . Smoking status: Former Games developer  . Smokeless tobacco: Never Used  . Tobacco comment: "smoked as a teenager for a little while"; smoke exposure from husband who was a heavy smoker  Substance Use Topics  . Alcohol use: No    Family History  Problem Relation Age of Onset  . Cancer - Cervical Sister      Review of Systems  Positive ROS: neg except for recent Covid   All other systems have been reviewed and were otherwise negative with the exception of those mentioned in the HPI and as above.  Objective: Vital signs in last 24 hours: Temp:  [97.6 F (36.4 C)] 97.6 F (36.4 C) (03/21 0900) Pulse Rate:  [88] 88 (03/21 0900) Resp:  [20] 20 (03/21 0900) BP: (153)/(74) 153/74 (03/21 0900) SpO2:  [97 %] 97 % (03/21 0900) Weight:  [59 kg] 59 kg (03/21 0900)  General Appearance: Alert, cooperative, no distress, appears stated age Head: Normocephalic, without obvious abnormality, atraumatic Eyes: PERRL, conjunctiva/corneas clear, EOM's intact    Neck: Supple, symmetrical, trachea midline Back: Symmetric, no curvature, ROM normal, no CVA tenderness Lungs:  respirations unlabored Heart: Regular rate and rhythm Abdomen: Soft, non-tender Extremities: Extremities normal, atraumatic, no cyanosis or edema Pulses: 2+ and symmetric all extremities Skin: Skin color, texture, turgor normal, no rashes or lesions  NEUROLOGIC:   Mental status: Alert and oriented x4,  no aphasia, good attention span, fund of knowledge, and memory Motor Exam - grossly normal Sensory Exam - grossly normal Reflexes: 1+ Coordination - grossly normal Gait - grossly normal Balance - grossly  normal Cranial Nerves: I: smell Not tested  II: visual acuity  OS: nl    OD: nl  II: visual fields Full to confrontation  II: pupils Equal, round, reactive to light  III,VII: ptosis None  III,IV,VI: extraocular muscles  Full ROM  V: mastication Normal  V: facial light touch sensation  Normal  V,VII: corneal reflex  Present  VII: facial muscle function - upper  Normal  VII: facial muscle function - lower Normal  VIII: hearing Not tested  IX: soft palate elevation  Normal  IX,X: gag reflex Present  XI: trapezius strength  5/5  XI: sternocleidomastoid strength 5/5  XI: neck flexion strength  5/5  XII: tongue strength  Normal    Data Review Lab Results  Component Value Date   WBC 7.6 12/18/2020   HGB 12.5 12/18/2020   HCT 37.6 12/18/2020   MCV 94.0 12/18/2020   PLT 312 12/18/2020  Lab Results  Component Value Date   NA 137 12/18/2020   K 3.7 12/18/2020   CL 104 12/18/2020   CO2 24 12/18/2020   BUN 23 12/18/2020   CREATININE 0.99 12/18/2020   GLUCOSE 96 12/18/2020   Lab Results  Component Value Date   INR 1.1 11/23/2020    Assessment/Plan:  Estimated body mass index is 25.39 kg/m as calculated from the following:   Height as of this encounter: 5' (1.524 m).   Weight as of this encounter: 59 kg. Patient admitted for laminectomy for T12-L1 synovial cyst. Patient has failed a reasonable attempt at conservative therapy.  I explained the condition and procedure to the patient and answered any questions.  Patient wishes to proceed with procedure as planned. Understands risks/ benefits and typical outcomes of procedure.   Tia Alert 12/18/2020 10:39 AM

## 2020-12-18 NOTE — Evaluation (Addendum)
Physical Therapy Evaluation Patient Details Name: Robin Patterson MRN: [id number] DOB: 11/18/1925 Today's Date: 12/18/2020   History of Present Illness  The pt is a 87 yo female presenting 3/21 s/p T12-L1 laminectomy for synovial cyst. PMH inlcudes: anxiety, arthritis, GERD, HOH, HTN, R THA, and multiple lumbar spine surgeries.    Clinical Impression  Pt in bed upon arrival of PT, agreeable to evaluation at this time. Prior to admission the pt was independent with mobility and ADLs, living with her daughter in a home with 2-3 steps to enter, and using RW at times to manage pain prior to surgery. The pt now presents with limitations in functional mobility, power, endurance, and dynamic stability due to above dx and resulting pain, and will continue to benefit from skilled PT to address these deficits, but is safe to return home with family assist when medically cleared. The pt was able to complete initial bed mobility and transfers with minA to minG and verbal cues for log roll technique. She was able to steady without UE support on initial stand, but benefitted from BUE support on RW for gait in room and ~365ft hallway ambulation. The pt also completed stairs, but requires minA and verbal cues for technique in addition to use of single rail to maintain stability. The pt was educated on spinal precautions, application of precautions to everyday movements, and progressive walking program. The pt expressed understanding of all education. Will plan to progress functional strength, endurance, and stability prior to anticipated d/c home.      Follow Up Recommendations No PT follow up;Supervision for mobility/OOB    Equipment Recommendations  None recommended by PT    Recommendations for Other Services       Precautions / Restrictions Precautions Precautions: Back;Fall Precaution Booklet Issued: Yes (comment) Precaution Comments: verbalized 3/3, discussed application to movements and ADLs Required  Braces or Orthoses:  (no brace needed) Restrictions Weight Bearing Restrictions: No      Mobility  Bed Mobility Overal bed mobility: Needs Assistance Bed Mobility: Rolling;Sidelying to Sit;Sit to Sidelying Rolling: Min guard Sidelying to sit: Min guard;HOB elevated     Sit to sidelying: Min assist General bed mobility comments: minG with verbal cues for log roll to come to sitting EOB, minA to LE and VC for return to sidelying    Transfers Overall transfer level: Needs assistance Equipment used: Rolling walker (2 wheeled) Transfers: Sit to/from Stand Sit to Stand: Min guard         General transfer comment: minG with VC for hand placement with RW.  Ambulation/Gait Ambulation/Gait assistance: Min guard;Min assist Gait Distance (Feet): 300 Feet Assistive device: Rolling walker (2 wheeled);1 person hand held assist Gait Pattern/deviations: Step-through pattern;Decreased stride length;Decreased weight shift to right;Decreased stance time - right;Trunk flexed Gait velocity: decreased   General Gait Details: pt with short strides, decresed wt shift to R. minA without UE support, increased lateral sway and small staggering steps. improved stability with RW requiring minG only for safety      Balance Overall balance assessment: Needs assistance Sitting-balance support: No upper extremity supported;Feet unsupported Sitting balance-Leahy Scale: Good     Standing balance support: Bilateral upper extremity supported;Single extremity supported Standing balance-Leahy Scale: Poor Standing balance comment: reliant on at least single UE support                             Pertinent Vitals/Pain Pain Assessment: Faces Faces Pain Scale: Hurts little more  Pain Location: back, R hip Pain Descriptors / Indicators: Burning;Discomfort Pain Intervention(s): Limited activity within patient's tolerance;Monitored during session;Repositioned    Home Living Family/patient  expects to be discharged to:: Private residence Living Arrangements: Children Available Help at Discharge: Family;Available 24 hours/day Type of Home: House Home Access: Stairs to enter Entrance Stairs-Rails: Doctor, general practice of Steps: 2-3 Home Layout: One level Home Equipment: Walker - 4 wheels;Walker - 2 wheels;Cane - quad;Shower seat - built in;Grab bars - tub/shower Additional Comments: pt lives with daughter    Prior Function Level of Independence: Independent         Comments: independent with use of RW intermittently prior to surgery due to pain in R hip, but able to ambulate in home without falls or AD when not having pain     Hand Dominance   Dominant Hand: Right    Extremity/Trunk Assessment   Upper Extremity Assessment Upper Extremity Assessment: Overall WFL for tasks assessed    Lower Extremity Assessment Lower Extremity Assessment: Overall WFL for tasks assessed    Cervical / Trunk Assessment Cervical / Trunk Assessment: Other exceptions Cervical / Trunk Exceptions: s/p back surgery  Communication   Communication: HOH  Cognition Arousal/Alertness: Awake/alert Behavior During Therapy: WFL for tasks assessed/performed Overall Cognitive Status: Within Functional Limits for tasks assessed                                        General Comments General comments (skin integrity, edema, etc.): VSS on RA    Exercises     Assessment/Plan    PT Assessment Patient needs continued PT services  PT Problem List Decreased strength;Decreased activity tolerance;Decreased balance;Decreased mobility;Decreased coordination;Decreased cognition;Pain       PT Treatment Interventions      PT Goals (Current goals can be found in the Care Plan section)  Acute Rehab PT Goals Patient Stated Goal: return home PT Goal Formulation: With patient Time For Goal Achievement: 01/01/21 Potential to Achieve Goals: Good    Frequency Min  5X/week    AM-PAC PT "6 Clicks" Mobility  Outcome Measure Help needed turning from your back to your side while in a flat bed without using bedrails?: A Little Help needed moving from lying on your back to sitting on the side of a flat bed without using bedrails?: A Little Help needed moving to and from a bed to a chair (including a wheelchair)?: A Little Help needed standing up from a chair using your arms (e.g., wheelchair or bedside chair)?: A Little Help needed to walk in hospital room?: A Little Help needed climbing 3-5 steps with a railing? : A Little 6 Click Score: 18    End of Session Equipment Utilized During Treatment: Gait belt Activity Tolerance: Patient tolerated treatment well Patient left: in bed;with call bell/phone within reach;with family/visitor present;with SCD's reapplied Nurse Communication: Mobility status PT Visit Diagnosis: Unsteadiness on feet (R26.81);Other abnormalities of gait and mobility (R26.89)    Time: 4431-5400 PT Time Calculation (min) (ACUTE ONLY): 31 min   Charges:   PT Evaluation $PT Eval Low Complexity: 1 Low PT Treatments $Gait Training: 8-22 mins        Rolm Baptise, PT, DPT   Acute Rehabilitation Department Pager #: [phone]  Gaetana Michaelis 12/18/2020, 5:50 PM

## 2020-12-18 NOTE — Anesthesia Postprocedure Evaluation (Signed)
Anesthesia Post Note  Patient: Robin Patterson  Procedure(s) Performed: Laminectomy for facet/synovial cyst - Thoracic twelve-Lumbar one (N/A Back)     Patient location during evaluation: PACU Anesthesia Type: General Level of consciousness: awake and alert Pain management: pain level controlled Vital Signs Assessment: post-procedure vital signs reviewed and stable Respiratory status: spontaneous breathing, nonlabored ventilation, respiratory function stable and patient connected to nasal cannula oxygen Cardiovascular status: blood pressure returned to baseline and stable Postop Assessment: no apparent nausea or vomiting Anesthetic complications: no   No complications documented.  Last Vitals:  Vitals:   12/18/20 1335 12/18/20 1350  BP: 131/65 (!) 147/71  Pulse: 79 79  Resp: 15 20  Temp:  (!) 36.2 C  SpO2: 97% 100%    Last Pain:  Vitals:   12/18/20 1350  TempSrc:   PainSc: 0-No pain                 Earl Lites P Hanaa Payes

## 2020-12-18 NOTE — Discharge Summary (Signed)
Physician Discharge Summary  Patient ID: Robin Patterson MRN: [id number] DOB/AGE: 12-24-1925 87 y.o.  Admit date: 12/18/2020 Discharge date: 12/18/2020  Admission Diagnoses: T12-L1 synovial cyst, radiculopathy    Discharge Diagnoses: same   Discharged Condition: good  Hospital Course: The patient was admitted on 12/18/2020 and taken to the operating room where the patient underwent T12-L1 lami for synovial cyst. The patient tolerated the procedure well and was taken to the recovery room and then to the floor in stable condition. The hospital course was routine. There were no complications. The wound remained clean dry and intact. Pt had appropriate back soreness. No complaints of leg pain or new N/T/W. The patient remained afebrile with stable vital signs, and tolerated a regular diet. The patient continued to increase activities, and pain was well controlled with oral pain medications.   Consults: None  Significant Diagnostic Studies:  Results for orders placed or performed during the hospital encounter of 12/18/20  CBC  Result Value Ref Range   WBC 7.6 4.0 - 10.5 K/uL   RBC 4.00 3.87 - 5.11 MIL/uL   Hemoglobin 12.5 12.0 - 15.0 g/dL   HCT 17.4 08.1 - 44.8 %   MCV 94.0 80.0 - 100.0 fL   MCH 31.3 26.0 - 34.0 pg   MCHC 33.2 30.0 - 36.0 g/dL   RDW 18.5 63.1 - 49.7 %   Platelets 312 150 - 400 K/uL   nRBC 0.0 0.0 - 0.2 %  Basic metabolic panel  Result Value Ref Range   Sodium 137 135 - 145 mmol/L   Potassium 3.7 3.5 - 5.1 mmol/L   Chloride 104 98 - 111 mmol/L   CO2 24 22 - 32 mmol/L   Glucose, Bld 96 70 - 99 mg/dL   BUN 23 8 - 23 mg/dL   Creatinine, Ser 0.26 0.44 - 1.00 mg/dL   Calcium 9.6 8.9 - 37.8 mg/dL   GFR, Estimated 53 (L) >60 mL/min   Anion gap 9 5 - 15    Chest 2 View  Result Date: 11/24/2020 CLINICAL DATA:  Preop for back surgery EXAM: CHEST - 2 VIEW COMPARISON:  05/30/2014 FINDINGS: The heart size and mediastinal contours are within normal limits. Eventrated right  anterior diaphragm. Large lung volumes. Both lungs are clear. The visualized skeletal structures are unremarkable. IMPRESSION: No evidence of active disease. Hyperinflation. Electronically Signed   By: Marnee Spring M.D.   On: 11/24/2020 20:50    Antibiotics:  Anti-infectives (From admission, onward)   Start     Dose/Rate Route Frequency Ordered Stop   12/18/20 1930  ceFAZolin (ANCEF) IVPB 2g/100 mL premix        2 g 200 mL/hr over 30 Minutes Intravenous Every 8 hours 12/18/20 1505 12/19/20 1129   12/18/20 0900  ceFAZolin (ANCEF) IVPB 2g/100 mL premix        2 g 200 mL/hr over 30 Minutes Intravenous On call to O.R. 12/18/20 0857 12/18/20 1140      Discharge Exam: Blood pressure 138/67, pulse 81, temperature (!) 97.2 F (36.2 C), resp. rate 18, height 5' (1.524 m), weight 59 kg, SpO2 95 %. Neurologic: Grossly normal dressing dry  Discharge Medications:   Allergies as of 12/18/2020   No Known Allergies     Medication List    TAKE these medications   acetaminophen-codeine 300-30 MG tablet Commonly known as: TYLENOL #3 Take 0.5 tablets by mouth daily.   amLODipine 2.5 MG tablet Commonly known as: NORVASC Take 2.5 mg by mouth daily.  aspirin EC 81 MG tablet Take 81 mg by mouth daily. Swallow whole.   atorvastatin 20 MG tablet Commonly known as: LIPITOR Take 20 mg by mouth daily.   budesonide-formoterol 160-4.5 MCG/ACT inhaler Commonly known as: SYMBICORT Inhale 2 puffs into the lungs daily as needed (for wheezing).   celecoxib 200 MG capsule Commonly known as: CELEBREX Take 200 mg by mouth daily.   citalopram 20 MG tablet Commonly known as: CELEXA Take 20 mg by mouth daily.   clopidogrel 75 MG tablet Commonly known as: PLAVIX Take 75 mg by mouth daily.   esomeprazole 20 MG capsule Commonly known as: NEXIUM Take 20 mg by mouth daily at 12 noon.   Fiber Powd Take 1 Scoop by mouth at bedtime.   Fish Oil 1200 MG Caps Take 1,200 mg by mouth daily.    hydrochlorothiazide 12.5 MG capsule Commonly known as: MICROZIDE Take 12.5 mg by mouth daily.   HYDROcodone-acetaminophen 5-325 MG tablet Commonly known as: NORCO/VICODIN Take 1 tablet by mouth every 4 (four) hours as needed for moderate pain.   IRON-VITAMIN C PO Take 1 tablet by mouth daily.   losartan 100 MG tablet Commonly known as: COZAAR Take 100 mg by mouth daily.   methocarbamol 500 MG tablet Commonly known as: Robaxin Take 1 tablet (500 mg total) by mouth 4 (four) times daily.   metoprolol succinate 100 MG 24 hr tablet Commonly known as: TOPROL-XL Take 100 mg by mouth daily. Take with or immediately following a meal.   Vitamin D3 50 MCG (2000 UT) capsule Take 2,000 Units by mouth daily.       Disposition: home   Final Dx: T12-L1 lami for synovial cyst       Signed: Tia Alert 12/18/2020, 3:54 PM

## 2020-12-19 ENCOUNTER — Encounter (HOSPITAL_COMMUNITY): Payer: Self-pay | Admitting: Neurological Surgery

## 2020-12-19 MED FILL — Thrombin For Soln Kit 5000 Unit: CUTANEOUS | Qty: 2 | Status: AC

## 2020-12-19 MED FILL — Thrombin For Soln Kit 5000 Unit: CUTANEOUS | Qty: 1 | Status: AC

## 2020-12-25 ENCOUNTER — Encounter (HOSPITAL_COMMUNITY): Payer: Self-pay | Admitting: Neurological Surgery

## 2022-08-07 IMAGING — CR DG THORACOLUMBAR SPINE 2V
1 series · 1 of 1 positions shown · non-contrast
Comparison: Intraoperative radiograph 08/24/2014. outside lumbar
radiographs 11/04/2012.

CLINICAL DATA: [AGE] female undergoing spine surgery.

EXAM:
THORACOLUMBAR SPINE 1V

[lateral]
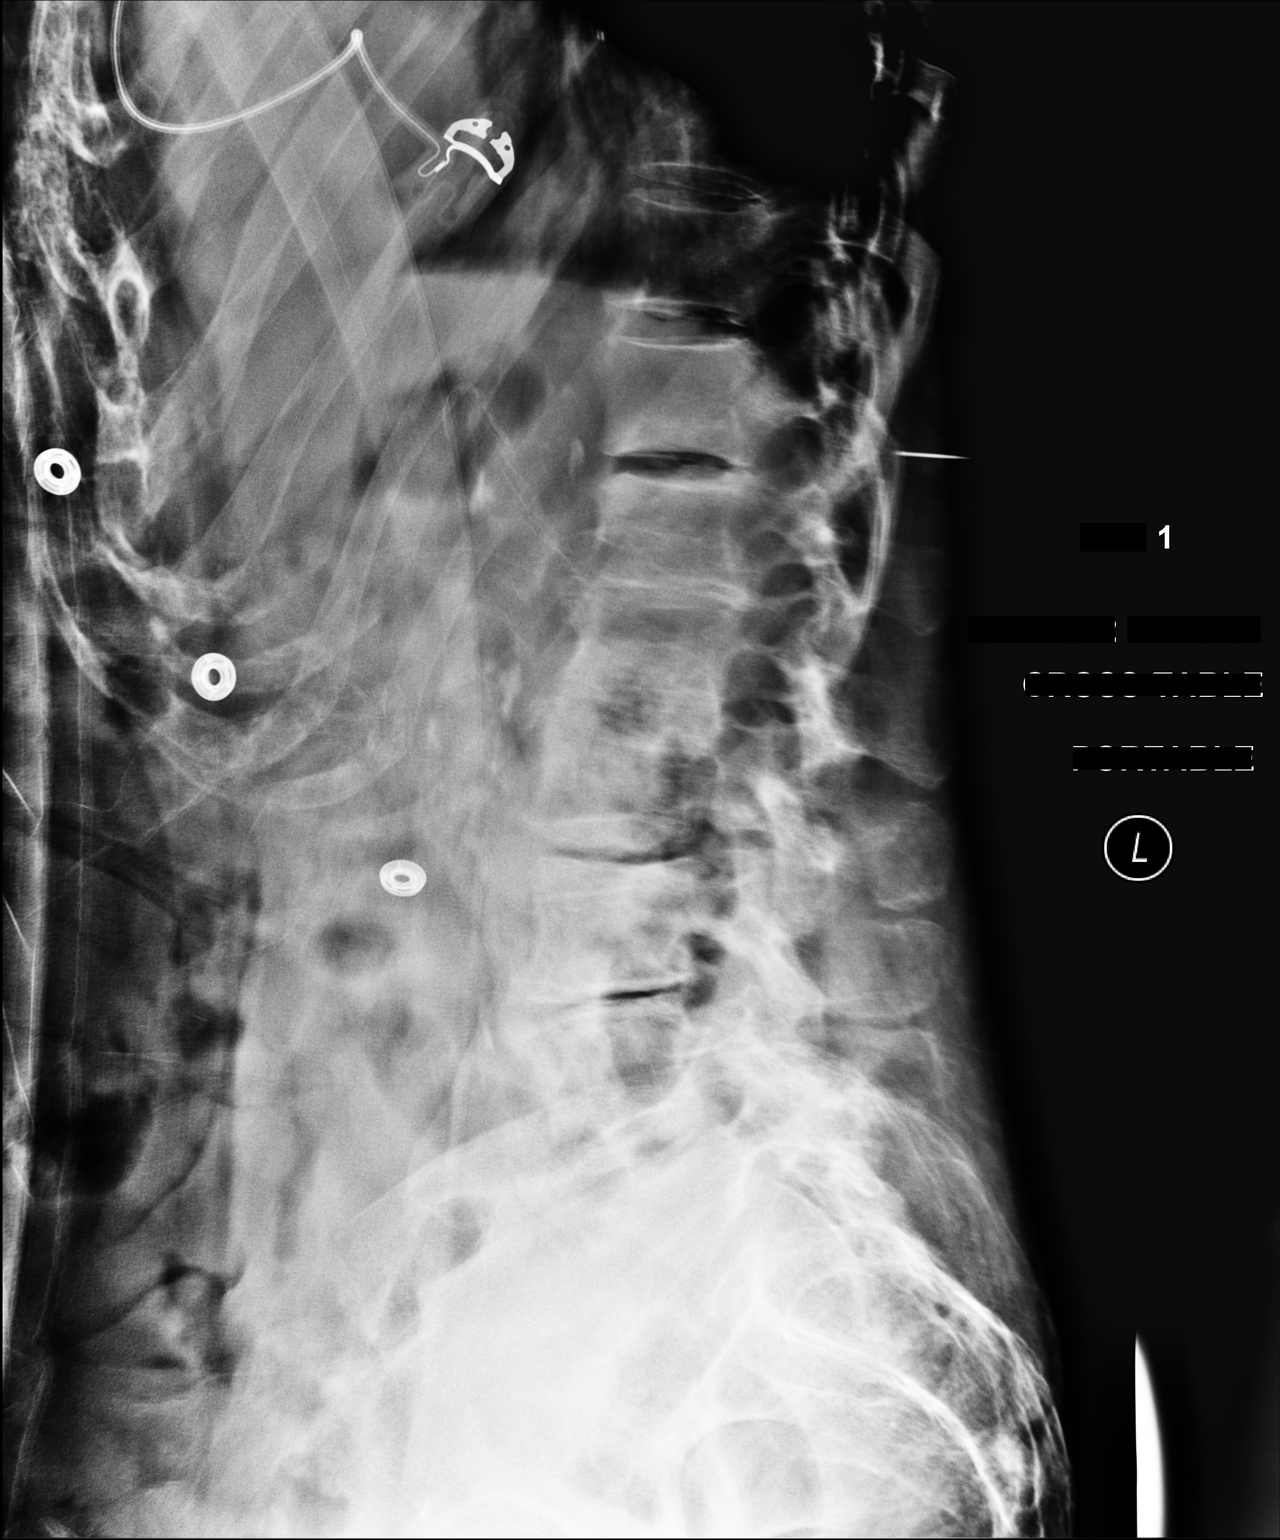

[1 of 1 positions shown; findings below may reference images not displayed]

FINDINGS: Intraoperative portable cross-table lateral view of the lumbar spine
on 12/18/2020 at 8851 hours. Normal lumbar segmentation demonstrated
in 4212 with full size ribs at T12. This image demonstrates a
posterior needle projected at the lower T12 vertebral body.
Prominent new T12-L1 vacuum disc since 7755. Chronic L1-L2 disc
space loss. Evidence of the mild lumbar scoliosis demonstrated in
4212. Vertebral height and alignment overall stable from 7755.
IMPRESSION: Intraoperative localization at T12, just cephalad to the T12-L1 disc
space where there is pronounced vacuum phenomena.
